# Patient Record
Sex: Female | Born: 1975
Health system: Southern US, Community
[De-identification: ages and names within clinical notes are randomized; demographics above are authoritative.]

---

## 2000-05-26 ENCOUNTER — Other Ambulatory Visit: Admission: RE | Admit: 2000-05-26 | Discharge: 2000-05-26 | Payer: Self-pay | Admitting: Neurology

## 2000-12-08 ENCOUNTER — Emergency Department (HOSPITAL_COMMUNITY): Admission: EM | Admit: 2000-12-08 | Discharge: 2000-12-08 | Payer: Self-pay | Admitting: Emergency Medicine

## 2000-12-08 ENCOUNTER — Encounter: Payer: Self-pay | Admitting: Emergency Medicine

## 2003-11-19 ENCOUNTER — Emergency Department (HOSPITAL_COMMUNITY): Admission: EM | Admit: 2003-11-19 | Discharge: 2003-11-19 | Payer: Self-pay | Admitting: Emergency Medicine

## 2005-08-30 ENCOUNTER — Ambulatory Visit (HOSPITAL_BASED_OUTPATIENT_CLINIC_OR_DEPARTMENT_OTHER): Admission: RE | Admit: 2005-08-30 | Discharge: 2005-08-30 | Payer: Self-pay | Admitting: Otolaryngology

## 2010-02-15 ENCOUNTER — Encounter: Payer: Self-pay | Admitting: Emergency Medicine

## 2010-02-16 ENCOUNTER — Encounter: Payer: Self-pay | Admitting: Emergency Medicine

## 2012-07-04 ENCOUNTER — Ambulatory Visit (HOSPITAL_COMMUNITY)
Admission: RE | Admit: 2012-07-04 | Discharge: 2012-07-04 | Disposition: A | Discharge status: Home / Self Care | Payer: Managed Care, Other (non HMO) | Source: Ambulatory Visit | Attending: *Deleted | Admitting: *Deleted

## 2012-07-04 ENCOUNTER — Ambulatory Visit (HOSPITAL_COMMUNITY): Payer: Self-pay | Admitting: Physical Therapy

## 2012-07-12 ENCOUNTER — Ambulatory Visit (HOSPITAL_COMMUNITY): Payer: Managed Care, Other (non HMO) | Admitting: Physical Therapy

## 2013-03-27 ENCOUNTER — Ambulatory Visit: Payer: Managed Care, Other (non HMO) | Admitting: Internal Medicine

## 2014-09-10 ENCOUNTER — Other Ambulatory Visit: Payer: Self-pay | Admitting: Internal Medicine

## 2014-09-10 DIAGNOSIS — N6452 Nipple discharge: Secondary | ICD-10-CM

## 2014-09-16 ENCOUNTER — Ambulatory Visit
Admission: RE | Admit: 2014-09-16 | Discharge: 2014-09-16 | Disposition: A | Discharge status: Home / Self Care | Payer: 59 | Source: Ambulatory Visit | Attending: Internal Medicine | Admitting: Internal Medicine

## 2014-09-16 DIAGNOSIS — N6452 Nipple discharge: Secondary | ICD-10-CM

## 2015-11-06 DIAGNOSIS — N6452 Nipple discharge: Secondary | ICD-10-CM | POA: Diagnosis not present

## 2015-11-06 DIAGNOSIS — F900 Attention-deficit hyperactivity disorder, predominantly inattentive type: Secondary | ICD-10-CM | POA: Diagnosis not present

## 2015-11-06 DIAGNOSIS — H7290 Unspecified perforation of tympanic membrane, unspecified ear: Secondary | ICD-10-CM | POA: Diagnosis not present

## 2015-11-06 DIAGNOSIS — Z0000 Encounter for general adult medical examination without abnormal findings: Secondary | ICD-10-CM | POA: Diagnosis not present

## 2015-11-06 DIAGNOSIS — E538 Deficiency of other specified B group vitamins: Secondary | ICD-10-CM | POA: Diagnosis not present

## 2015-11-06 DIAGNOSIS — M546 Pain in thoracic spine: Secondary | ICD-10-CM | POA: Diagnosis not present

## 2015-11-06 DIAGNOSIS — L409 Psoriasis, unspecified: Secondary | ICD-10-CM | POA: Diagnosis not present

## 2015-11-06 DIAGNOSIS — E559 Vitamin D deficiency, unspecified: Secondary | ICD-10-CM | POA: Diagnosis not present

## 2015-11-06 DIAGNOSIS — Z23 Encounter for immunization: Secondary | ICD-10-CM | POA: Diagnosis not present

## 2015-11-06 DIAGNOSIS — D538 Other specified nutritional anemias: Secondary | ICD-10-CM | POA: Diagnosis not present

## 2016-05-06 MED FILL — OMEPRAZOLE DR 20 MG CAPSULE: 20 | 30 days supply | Qty: 30 | Fill #0

## 2016-05-11 MED FILL — AMPHETAMINE SALTS 10 MG TAB: 10 | 30 days supply | Qty: 60 | Fill #0

## 2016-06-16 MED FILL — AMPHETAMINE SALTS 10 MG TAB: 10 | 30 days supply | Qty: 60 | Fill #0

## 2016-06-22 MED FILL — OMEPRAZOLE DR 20 MG CAPSULE: 20 | 90 days supply | Qty: 90 | Fill #1

## 2016-07-20 MED FILL — DEXTROAMP-AMP 10 MG TAB: 10 | 30 days supply | Qty: 60 | Fill #0

## 2016-08-18 MED FILL — ZOLPIDEM TARTRATE 10 MG TAB: 10 | 30 days supply | Qty: 30 | Fill #0

## 2016-08-18 MED FILL — CLOBETASOL 0.05% OINTMENT: 0.05 | 30 days supply | Qty: 120 | Fill #0

## 2016-08-19 MED FILL — AMPHETAMINE SALTS 10 MG TAB: 10 | 30 days supply | Qty: 60 | Fill #0

## 2016-08-23 MED FILL — ALPRAZolam 0.5 MG TABS: 0.5 | 30 days supply | Qty: 60 | Fill #0

## 2016-08-23 MED FILL — MEDROXYPROG 150 MG/ML SYR: 150 | 84 days supply | Qty: 1 | Fill #0

## 2016-09-02 MED FILL — OMEPRAZOLE DR 20 MG CAPSULE: 20 | 90 days supply | Qty: 90 | Fill #0

## 2016-09-14 MED FILL — CALCIPOTRIENE 0.005% CREAM: 0.005 | 15 days supply | Qty: 60 | Fill #0

## 2016-09-21 MED FILL — DEXTROAMP-AMP 10 MG TAB: 10 | 30 days supply | Qty: 60 | Fill #0

## 2016-10-21 MED FILL — AMPHETAMINE SALTS 10 MG TAB: 10 | 30 days supply | Qty: 60 | Fill #0

## 2016-11-18 DIAGNOSIS — F430 Acute stress reaction: Secondary | ICD-10-CM | POA: Diagnosis not present

## 2016-11-18 DIAGNOSIS — J069 Acute upper respiratory infection, unspecified: Secondary | ICD-10-CM | POA: Diagnosis not present

## 2016-11-18 DIAGNOSIS — Z1389 Encounter for screening for other disorder: Secondary | ICD-10-CM | POA: Diagnosis not present

## 2016-11-18 DIAGNOSIS — Z681 Body mass index (BMI) 19 or less, adult: Secondary | ICD-10-CM | POA: Diagnosis not present

## 2016-11-18 DIAGNOSIS — F321 Major depressive disorder, single episode, moderate: Secondary | ICD-10-CM | POA: Diagnosis not present

## 2016-12-23 MED FILL — IBUPROFEN 800 MG TABS: 800 | 30 days supply | Qty: 90 | Fill #0

## 2016-12-28 MED FILL — DEXTROAMP-AMP 10 MG TAB: 10 | 30 days supply | Qty: 60 | Fill #0

## 2017-01-07 MED FILL — medroxyPROGESTERone ACETATE: 150 | 84 days supply | Qty: 1 | Fill #1

## 2017-01-07 MED FILL — OMEPRAZOLE 20 MG CAP: 20 | 90 days supply | Qty: 90 | Fill #1

## 2017-01-07 MED FILL — DICLOFENAC SODIUM 1% GEL: 1 | 25 days supply | Qty: 300 | Fill #0

## 2017-03-15 MED FILL — FLUoxetine HCL 20 MG TABS: 20 | 30 days supply | Qty: 30 | Fill #0

## 2017-03-30 MED FILL — AMPHETAMINE-DEXTROAMPHETAMI: 10 | 30 days supply | Qty: 60 | Fill #0

## 2017-04-19 MED FILL — medroxyPROGESTERone ACETATE: 150 | 84 days supply | Qty: 1 | Fill #2

## 2017-04-19 MED FILL — FLUoxetine HCL 40 MG CAPS: 40 | 30 days supply | Qty: 30 | Fill #0

## 2017-04-19 MED FILL — OMEPRAZOLE 20 MG CAP: 20 | 90 days supply | Qty: 90 | Fill #2

## 2017-04-27 MED FILL — ADDERALL XR 20 MG CAP SA: 20 | 30 days supply | Qty: 30 | Fill #0

## 2017-05-17 MED FILL — FLUoxetine HCL 40 MG CAPS: 40 | 30 days supply | Qty: 30 | Fill #1

## 2017-06-02 MED FILL — ADDERALL XR 20 MG CAP SA: 20 | 30 days supply | Qty: 30 | Fill #0

## 2017-06-16 MED FILL — FLUoxetine HCL 40 MG CAPS: 40 | 30 days supply | Qty: 30 | Fill #2

## 2017-06-17 DIAGNOSIS — F430 Acute stress reaction: Secondary | ICD-10-CM | POA: Diagnosis not present

## 2017-07-01 MED FILL — ADDERALL XR 20 MG CAP SA: 20 | 30 days supply | Qty: 30 | Fill #0

## 2017-07-05 MED FILL — OMEPRAZOLE 20 MG CAP: 20 | 90 days supply | Qty: 90 | Fill #3

## 2017-07-07 MED FILL — valACYclovir HCL 1 GM TABS: 1 | 7 days supply | Qty: 25 | Fill #0

## 2017-07-11 DIAGNOSIS — F430 Acute stress reaction: Secondary | ICD-10-CM | POA: Diagnosis not present

## 2017-07-11 DIAGNOSIS — F321 Major depressive disorder, single episode, moderate: Secondary | ICD-10-CM | POA: Diagnosis not present

## 2017-07-13 DIAGNOSIS — Z0000 Encounter for general adult medical examination without abnormal findings: Secondary | ICD-10-CM | POA: Diagnosis not present

## 2017-07-13 DIAGNOSIS — E559 Vitamin D deficiency, unspecified: Secondary | ICD-10-CM | POA: Diagnosis not present

## 2017-07-13 DIAGNOSIS — Z1389 Encounter for screening for other disorder: Secondary | ICD-10-CM | POA: Diagnosis not present

## 2017-07-13 DIAGNOSIS — M546 Pain in thoracic spine: Secondary | ICD-10-CM | POA: Diagnosis not present

## 2017-07-13 DIAGNOSIS — F321 Major depressive disorder, single episode, moderate: Secondary | ICD-10-CM | POA: Diagnosis not present

## 2017-07-13 DIAGNOSIS — F430 Acute stress reaction: Secondary | ICD-10-CM | POA: Diagnosis not present

## 2017-07-13 DIAGNOSIS — F900 Attention-deficit hyperactivity disorder, predominantly inattentive type: Secondary | ICD-10-CM | POA: Diagnosis not present

## 2017-07-13 DIAGNOSIS — E538 Deficiency of other specified B group vitamins: Secondary | ICD-10-CM | POA: Diagnosis not present

## 2017-07-13 DIAGNOSIS — D528 Other folate deficiency anemias: Secondary | ICD-10-CM | POA: Diagnosis not present

## 2017-07-13 DIAGNOSIS — R82998 Other abnormal findings in urine: Secondary | ICD-10-CM | POA: Diagnosis not present

## 2017-07-13 DIAGNOSIS — R002 Palpitations: Secondary | ICD-10-CM | POA: Diagnosis not present

## 2017-07-19 MED FILL — IBUPROFEN 800 MG TAB: 800 | 30 days supply | Qty: 90 | Fill #1

## 2017-07-19 MED FILL — FLUoxetine HCL 40 MG CAPS: 40 | 30 days supply | Qty: 30 | Fill #3

## 2017-08-02 MED FILL — ADDERALL XR 20 MG CAP SA: 20 | 30 days supply | Qty: 30 | Fill #0

## 2017-08-05 DIAGNOSIS — F430 Acute stress reaction: Secondary | ICD-10-CM | POA: Diagnosis not present

## 2017-08-05 DIAGNOSIS — F321 Major depressive disorder, single episode, moderate: Secondary | ICD-10-CM | POA: Diagnosis not present

## 2017-08-10 MED FILL — medroxyPROGESTERone ACETATE: 150 | 84 days supply | Qty: 1 | Fill #3

## 2017-08-18 MED FILL — FLUoxetine HCL 40 MG CAPS: 40 | 30 days supply | Qty: 30 | Fill #4

## 2017-08-31 DIAGNOSIS — F321 Major depressive disorder, single episode, moderate: Secondary | ICD-10-CM | POA: Diagnosis not present

## 2017-08-31 DIAGNOSIS — F430 Acute stress reaction: Secondary | ICD-10-CM | POA: Diagnosis not present

## 2017-09-22 MED FILL — FLUoxetine HCL 40 MG CAPS: 40 | 30 days supply | Qty: 30 | Fill #5

## 2017-10-04 MED FILL — FLUTICASONE PROP 50 MCG SPR: 50 | 30 days supply | Qty: 16 | Fill #0

## 2017-10-04 MED FILL — OMEPRAZOLE 20 MG CPDR: 20 | 30 days supply | Qty: 30 | Fill #0

## 2017-10-17 DIAGNOSIS — F430 Acute stress reaction: Secondary | ICD-10-CM | POA: Diagnosis not present

## 2017-10-17 DIAGNOSIS — F1010 Alcohol abuse, uncomplicated: Secondary | ICD-10-CM | POA: Diagnosis not present

## 2017-10-17 DIAGNOSIS — F321 Major depressive disorder, single episode, moderate: Secondary | ICD-10-CM | POA: Diagnosis not present

## 2017-10-18 DIAGNOSIS — F1020 Alcohol dependence, uncomplicated: Secondary | ICD-10-CM | POA: Diagnosis not present

## 2017-10-19 DIAGNOSIS — F1020 Alcohol dependence, uncomplicated: Secondary | ICD-10-CM | POA: Diagnosis not present

## 2017-10-20 MED FILL — medroxyPROGESTERone ACETATE: 150 | 84 days supply | Qty: 1 | Fill #0

## 2017-10-20 MED FILL — FLUoxetine HCL 40 MG CAPS: 40 | 30 days supply | Qty: 30 | Fill #6

## 2017-10-21 DIAGNOSIS — F1020 Alcohol dependence, uncomplicated: Secondary | ICD-10-CM | POA: Diagnosis not present

## 2017-10-24 DIAGNOSIS — F1020 Alcohol dependence, uncomplicated: Secondary | ICD-10-CM | POA: Diagnosis not present

## 2017-10-24 DIAGNOSIS — F1110 Opioid abuse, uncomplicated: Secondary | ICD-10-CM | POA: Diagnosis not present

## 2017-10-24 DIAGNOSIS — F1220 Cannabis dependence, uncomplicated: Secondary | ICD-10-CM | POA: Diagnosis not present

## 2017-10-25 DIAGNOSIS — F1020 Alcohol dependence, uncomplicated: Secondary | ICD-10-CM | POA: Diagnosis not present

## 2017-10-26 DIAGNOSIS — F1020 Alcohol dependence, uncomplicated: Secondary | ICD-10-CM | POA: Diagnosis not present

## 2017-10-27 DIAGNOSIS — F430 Acute stress reaction: Secondary | ICD-10-CM | POA: Diagnosis not present

## 2017-10-27 DIAGNOSIS — F321 Major depressive disorder, single episode, moderate: Secondary | ICD-10-CM | POA: Diagnosis not present

## 2017-10-27 DIAGNOSIS — F1010 Alcohol abuse, uncomplicated: Secondary | ICD-10-CM | POA: Diagnosis not present

## 2017-10-28 DIAGNOSIS — F1020 Alcohol dependence, uncomplicated: Secondary | ICD-10-CM | POA: Diagnosis not present

## 2017-10-31 DIAGNOSIS — F1220 Cannabis dependence, uncomplicated: Secondary | ICD-10-CM | POA: Diagnosis not present

## 2017-10-31 DIAGNOSIS — F1020 Alcohol dependence, uncomplicated: Secondary | ICD-10-CM | POA: Diagnosis not present

## 2017-10-31 DIAGNOSIS — F1110 Opioid abuse, uncomplicated: Secondary | ICD-10-CM | POA: Diagnosis not present

## 2017-10-31 MED FILL — ADDERALL XR 20 MG CAP SA: 20 | 30 days supply | Qty: 30 | Fill #0

## 2017-10-31 MED FILL — ZOLPIDEM TARTRATE 10 MG TAB: 10 | 30 days supply | Qty: 30 | Fill #0

## 2017-11-01 DIAGNOSIS — F1020 Alcohol dependence, uncomplicated: Secondary | ICD-10-CM | POA: Diagnosis not present

## 2017-11-02 DIAGNOSIS — F1020 Alcohol dependence, uncomplicated: Secondary | ICD-10-CM | POA: Diagnosis not present

## 2017-11-02 MED FILL — OMEPRAZOLE 20 MG CPDR: 20 | 90 days supply | Qty: 90 | Fill #1

## 2017-11-03 DIAGNOSIS — F430 Acute stress reaction: Secondary | ICD-10-CM | POA: Diagnosis not present

## 2017-11-03 DIAGNOSIS — F1010 Alcohol abuse, uncomplicated: Secondary | ICD-10-CM | POA: Diagnosis not present

## 2017-11-03 DIAGNOSIS — F321 Major depressive disorder, single episode, moderate: Secondary | ICD-10-CM | POA: Diagnosis not present

## 2017-11-04 DIAGNOSIS — F1020 Alcohol dependence, uncomplicated: Secondary | ICD-10-CM | POA: Diagnosis not present

## 2017-11-07 DIAGNOSIS — F1110 Opioid abuse, uncomplicated: Secondary | ICD-10-CM | POA: Diagnosis not present

## 2017-11-07 DIAGNOSIS — F1220 Cannabis dependence, uncomplicated: Secondary | ICD-10-CM | POA: Diagnosis not present

## 2017-11-07 DIAGNOSIS — F1020 Alcohol dependence, uncomplicated: Secondary | ICD-10-CM | POA: Diagnosis not present

## 2017-11-08 DIAGNOSIS — F1020 Alcohol dependence, uncomplicated: Secondary | ICD-10-CM | POA: Diagnosis not present

## 2017-11-09 DIAGNOSIS — F1020 Alcohol dependence, uncomplicated: Secondary | ICD-10-CM | POA: Diagnosis not present

## 2017-11-10 DIAGNOSIS — F1010 Alcohol abuse, uncomplicated: Secondary | ICD-10-CM | POA: Diagnosis not present

## 2017-11-10 DIAGNOSIS — F430 Acute stress reaction: Secondary | ICD-10-CM | POA: Diagnosis not present

## 2017-11-11 DIAGNOSIS — F1020 Alcohol dependence, uncomplicated: Secondary | ICD-10-CM | POA: Diagnosis not present

## 2017-11-14 DIAGNOSIS — F1020 Alcohol dependence, uncomplicated: Secondary | ICD-10-CM | POA: Diagnosis not present

## 2017-11-14 DIAGNOSIS — F1110 Opioid abuse, uncomplicated: Secondary | ICD-10-CM | POA: Diagnosis not present

## 2017-11-14 DIAGNOSIS — F1220 Cannabis dependence, uncomplicated: Secondary | ICD-10-CM | POA: Diagnosis not present

## 2017-11-15 DIAGNOSIS — F1020 Alcohol dependence, uncomplicated: Secondary | ICD-10-CM | POA: Diagnosis not present

## 2017-11-16 DIAGNOSIS — F1020 Alcohol dependence, uncomplicated: Secondary | ICD-10-CM | POA: Diagnosis not present

## 2017-11-18 DIAGNOSIS — F1020 Alcohol dependence, uncomplicated: Secondary | ICD-10-CM | POA: Diagnosis not present

## 2017-11-21 DIAGNOSIS — F1220 Cannabis dependence, uncomplicated: Secondary | ICD-10-CM | POA: Diagnosis not present

## 2017-11-21 DIAGNOSIS — F1020 Alcohol dependence, uncomplicated: Secondary | ICD-10-CM | POA: Diagnosis not present

## 2017-11-21 DIAGNOSIS — F1110 Opioid abuse, uncomplicated: Secondary | ICD-10-CM | POA: Diagnosis not present

## 2017-11-22 DIAGNOSIS — F1020 Alcohol dependence, uncomplicated: Secondary | ICD-10-CM | POA: Diagnosis not present

## 2017-11-22 MED FILL — FLUoxetine HCL 40 MG CAPS: 40 | 90 days supply | Qty: 90 | Fill #0

## 2017-11-23 DIAGNOSIS — F1020 Alcohol dependence, uncomplicated: Secondary | ICD-10-CM | POA: Diagnosis not present

## 2017-11-24 DIAGNOSIS — F430 Acute stress reaction: Secondary | ICD-10-CM | POA: Diagnosis not present

## 2017-11-24 DIAGNOSIS — F900 Attention-deficit hyperactivity disorder, predominantly inattentive type: Secondary | ICD-10-CM | POA: Diagnosis not present

## 2017-11-24 DIAGNOSIS — F1010 Alcohol abuse, uncomplicated: Secondary | ICD-10-CM | POA: Diagnosis not present

## 2017-11-24 DIAGNOSIS — F321 Major depressive disorder, single episode, moderate: Secondary | ICD-10-CM | POA: Diagnosis not present

## 2017-11-25 DIAGNOSIS — F1020 Alcohol dependence, uncomplicated: Secondary | ICD-10-CM | POA: Diagnosis not present

## 2017-11-28 DIAGNOSIS — F1220 Cannabis dependence, uncomplicated: Secondary | ICD-10-CM | POA: Diagnosis not present

## 2017-11-28 DIAGNOSIS — F1110 Opioid abuse, uncomplicated: Secondary | ICD-10-CM | POA: Diagnosis not present

## 2017-11-28 DIAGNOSIS — F1020 Alcohol dependence, uncomplicated: Secondary | ICD-10-CM | POA: Diagnosis not present

## 2017-11-29 DIAGNOSIS — F1020 Alcohol dependence, uncomplicated: Secondary | ICD-10-CM | POA: Diagnosis not present

## 2017-11-30 DIAGNOSIS — F1020 Alcohol dependence, uncomplicated: Secondary | ICD-10-CM | POA: Diagnosis not present

## 2017-11-30 MED FILL — ADDERALL XR 20 MG CAP SA: 20 | 30 days supply | Qty: 30 | Fill #0

## 2017-12-02 DIAGNOSIS — F1020 Alcohol dependence, uncomplicated: Secondary | ICD-10-CM | POA: Diagnosis not present

## 2017-12-05 DIAGNOSIS — F1020 Alcohol dependence, uncomplicated: Secondary | ICD-10-CM | POA: Diagnosis not present

## 2017-12-05 DIAGNOSIS — F1110 Opioid abuse, uncomplicated: Secondary | ICD-10-CM | POA: Diagnosis not present

## 2017-12-05 DIAGNOSIS — F1220 Cannabis dependence, uncomplicated: Secondary | ICD-10-CM | POA: Diagnosis not present

## 2017-12-06 DIAGNOSIS — F1020 Alcohol dependence, uncomplicated: Secondary | ICD-10-CM | POA: Diagnosis not present

## 2017-12-07 DIAGNOSIS — F1020 Alcohol dependence, uncomplicated: Secondary | ICD-10-CM | POA: Diagnosis not present

## 2017-12-09 DIAGNOSIS — F1020 Alcohol dependence, uncomplicated: Secondary | ICD-10-CM | POA: Diagnosis not present

## 2017-12-09 DIAGNOSIS — F430 Acute stress reaction: Secondary | ICD-10-CM | POA: Diagnosis not present

## 2017-12-09 DIAGNOSIS — F1010 Alcohol abuse, uncomplicated: Secondary | ICD-10-CM | POA: Diagnosis not present

## 2017-12-12 DIAGNOSIS — F1110 Opioid abuse, uncomplicated: Secondary | ICD-10-CM | POA: Diagnosis not present

## 2017-12-12 DIAGNOSIS — F1220 Cannabis dependence, uncomplicated: Secondary | ICD-10-CM | POA: Diagnosis not present

## 2017-12-12 DIAGNOSIS — F1020 Alcohol dependence, uncomplicated: Secondary | ICD-10-CM | POA: Diagnosis not present

## 2017-12-13 DIAGNOSIS — F1020 Alcohol dependence, uncomplicated: Secondary | ICD-10-CM | POA: Diagnosis not present

## 2017-12-13 MED FILL — ZOLPIDEM TARTRATE 10 MG TAB: 10 | 30 days supply | Qty: 30 | Fill #1

## 2017-12-14 DIAGNOSIS — F1020 Alcohol dependence, uncomplicated: Secondary | ICD-10-CM | POA: Diagnosis not present

## 2017-12-16 DIAGNOSIS — F1020 Alcohol dependence, uncomplicated: Secondary | ICD-10-CM | POA: Diagnosis not present

## 2017-12-19 DIAGNOSIS — F1110 Opioid abuse, uncomplicated: Secondary | ICD-10-CM | POA: Diagnosis not present

## 2017-12-19 DIAGNOSIS — F1020 Alcohol dependence, uncomplicated: Secondary | ICD-10-CM | POA: Diagnosis not present

## 2017-12-19 DIAGNOSIS — F1220 Cannabis dependence, uncomplicated: Secondary | ICD-10-CM | POA: Diagnosis not present

## 2017-12-20 DIAGNOSIS — F1020 Alcohol dependence, uncomplicated: Secondary | ICD-10-CM | POA: Diagnosis not present

## 2017-12-21 DIAGNOSIS — F1020 Alcohol dependence, uncomplicated: Secondary | ICD-10-CM | POA: Diagnosis not present

## 2017-12-23 DIAGNOSIS — F1020 Alcohol dependence, uncomplicated: Secondary | ICD-10-CM | POA: Diagnosis not present

## 2017-12-26 DIAGNOSIS — F1020 Alcohol dependence, uncomplicated: Secondary | ICD-10-CM | POA: Diagnosis not present

## 2017-12-27 DIAGNOSIS — F1020 Alcohol dependence, uncomplicated: Secondary | ICD-10-CM | POA: Diagnosis not present

## 2017-12-28 DIAGNOSIS — F1020 Alcohol dependence, uncomplicated: Secondary | ICD-10-CM | POA: Diagnosis not present

## 2017-12-28 DIAGNOSIS — F430 Acute stress reaction: Secondary | ICD-10-CM | POA: Diagnosis not present

## 2017-12-28 DIAGNOSIS — F1010 Alcohol abuse, uncomplicated: Secondary | ICD-10-CM | POA: Diagnosis not present

## 2017-12-28 MED FILL — ADDERALL XR 20 MG CAP SA: 20 | 30 days supply | Qty: 30 | Fill #0

## 2018-01-30 MED FILL — ADDERALL XR 20 MG CAP SA: 20 | 30 days supply | Qty: 30 | Fill #0

## 2018-01-31 MED FILL — ZOLPIDEM TARTRATE 10 MG TAB: 10 | 30 days supply | Qty: 30 | Fill #2

## 2018-01-31 MED FILL — OMEPRAZOLE 20 MG CPDR: 20 | 90 days supply | Qty: 90 | Fill #2

## 2018-02-17 MED FILL — FLUoxetine HCL 40 MG CAPS: 40 | 90 days supply | Qty: 90 | Fill #0

## 2018-02-27 MED FILL — ADDERALL XR 20 MG CAP SA: 20 | 30 days supply | Qty: 30 | Fill #0

## 2018-03-31 MED FILL — IBUPROFEN 800 MG TABS: 800 | 30 days supply | Qty: 90 | Fill #0

## 2018-03-31 MED FILL — METHOCARBAMOL 500 MG TABS: 500 | 30 days supply | Qty: 90 | Fill #0

## 2018-03-31 MED FILL — ADDERALL XR 20 MG CAP SA: 20 | 30 days supply | Qty: 30 | Fill #0

## 2018-04-20 MED FILL — ZOLPIDEM TARTRATE 10 MG TAB: 10 | 30 days supply | Qty: 30 | Fill #0

## 2018-04-26 MED FILL — valACYclovir HCL 1 GM TABS: 1 | 7 days supply | Qty: 25 | Fill #0

## 2018-04-26 MED FILL — OMEPRAZOLE 20 MG CPDR: 20 | 90 days supply | Qty: 90 | Fill #0

## 2018-05-01 MED FILL — ADDERALL XR 20 MG CAP SA: 20 | 30 days supply | Qty: 30 | Fill #0

## 2018-05-23 MED FILL — FLUoxetine HCL 40 MG CAPS: 40 | 90 days supply | Qty: 90 | Fill #0

## 2018-05-23 MED FILL — FLUTICASONE PROP 50 MCG SPR: 50 | 30 days supply | Qty: 16 | Fill #0

## 2018-05-30 MED FILL — ADDERALL XR 20 MG CAP SA: 20 | 30 days supply | Qty: 30 | Fill #0

## 2018-06-23 MED FILL — ZOLPIDEM TARTRATE 10 MG TAB: 10 | 30 days supply | Qty: 30 | Fill #0

## 2018-06-29 MED FILL — ADDERALL XR 20 MG CAP SA: 20 | 30 days supply | Qty: 30 | Fill #0

## 2018-07-31 MED FILL — ZOLPIDEM TARTRATE 10 MG TAB: 10 | 30 days supply | Qty: 30 | Fill #1

## 2018-08-01 MED FILL — ADDERALL XR 20 MG CAP SA: 20 | 30 days supply | Qty: 30 | Fill #0

## 2018-08-21 MED FILL — FLUoxetine HCL 40 MG CAPS: 40 | 90 days supply | Qty: 90 | Fill #1

## 2018-08-21 MED FILL — OMEPRAZOLE 20 MG CAP: 20 | 90 days supply | Qty: 90 | Fill #0

## 2018-08-31 MED FILL — ADDERALL XR 20 MG CAP SA: 20 | 30 days supply | Qty: 30 | Fill #0

## 2018-10-04 MED FILL — ADDERALL XR 20 MG CAP SA: 20 | 30 days supply | Qty: 30 | Fill #0

## 2018-11-04 MED FILL — ADDERALL XR 20 MG CAP SA: 20 | 30 days supply | Qty: 30 | Fill #0

## 2018-11-15 MED FILL — FLUoxetine HCL 40 MG CAPS: 40 | 90 days supply | Qty: 90 | Fill #2

## 2018-11-15 MED FILL — OMEPRAZOLE 20 MG CAP: 20 | 90 days supply | Qty: 90 | Fill #0

## 2018-11-28 MED FILL — ZOLPIDEM TARTRATE 10 MG TAB: 10 | 30 days supply | Qty: 30 | Fill #2

## 2018-12-05 MED FILL — ADDERALL XR 20 MG CAP SA: 20 | 30 days supply | Qty: 30 | Fill #0

## 2018-12-12 DIAGNOSIS — R82998 Other abnormal findings in urine: Secondary | ICD-10-CM | POA: Diagnosis not present

## 2018-12-12 DIAGNOSIS — Z0000 Encounter for general adult medical examination without abnormal findings: Secondary | ICD-10-CM | POA: Diagnosis not present

## 2018-12-12 DIAGNOSIS — F1010 Alcohol abuse, uncomplicated: Secondary | ICD-10-CM | POA: Diagnosis not present

## 2018-12-12 DIAGNOSIS — D539 Nutritional anemia, unspecified: Secondary | ICD-10-CM | POA: Diagnosis not present

## 2018-12-12 DIAGNOSIS — M546 Pain in thoracic spine: Secondary | ICD-10-CM | POA: Diagnosis not present

## 2018-12-12 DIAGNOSIS — F321 Major depressive disorder, single episode, moderate: Secondary | ICD-10-CM | POA: Diagnosis not present

## 2018-12-12 DIAGNOSIS — E559 Vitamin D deficiency, unspecified: Secondary | ICD-10-CM | POA: Diagnosis not present

## 2018-12-12 DIAGNOSIS — E538 Deficiency of other specified B group vitamins: Secondary | ICD-10-CM | POA: Diagnosis not present

## 2018-12-12 DIAGNOSIS — J069 Acute upper respiratory infection, unspecified: Secondary | ICD-10-CM | POA: Diagnosis not present

## 2018-12-12 MED FILL — valACYclovir HCL 1 GM TABS: 1 | 7 days supply | Qty: 25 | Fill #0

## 2018-12-22 DIAGNOSIS — Z20828 Contact with and (suspected) exposure to other viral communicable diseases: Secondary | ICD-10-CM | POA: Diagnosis not present

## 2019-01-05 MED FILL — ADDERALL XR 20 MG CAP SA: 20 | 30 days supply | Qty: 30 | Fill #0

## 2019-02-08 MED FILL — ADDERALL XR 20 MG CAP SA: 20 | 30 days supply | Qty: 30 | Fill #0

## 2019-02-12 MED FILL — OMEPRAZOLE 20 MG CAP: 20 | 90 days supply | Qty: 90 | Fill #0

## 2019-02-12 MED FILL — FLUoxetine HCL 40 MG CAPS: 40 | 90 days supply | Qty: 90 | Fill #0

## 2019-02-20 MED FILL — METHOCARBAMOL 500 MG TABS: 500 | 30 days supply | Qty: 90 | Fill #0

## 2019-02-20 MED FILL — IBUPROFEN 800 MG TAB: 800 | 30 days supply | Qty: 90 | Fill #0

## 2019-03-08 MED FILL — ADDERALL XR 20 MG CAP SA: 20 | 30 days supply | Qty: 30 | Fill #0

## 2019-04-11 MED FILL — ADDERALL XR 20 MG CAP SA: 20 | 30 days supply | Qty: 30 | Fill #0

## 2019-05-10 MED FILL — ADDERALL XR 20 MG CAP SA: 20 | 60 days supply | Qty: 60 | Fill #0

## 2019-05-10 MED FILL — valACYclovir HCL 1 GM TABS: 1 | 7 days supply | Qty: 25 | Fill #1

## 2019-05-10 MED FILL — OMEPRAZOLE 20 MG CAP: 20 | 90 days supply | Qty: 90 | Fill #1

## 2019-05-10 MED FILL — FLUoxetine HCL 40 MG CAPS: 40 | 90 days supply | Qty: 90 | Fill #1

## 2019-05-18 DIAGNOSIS — M546 Pain in thoracic spine: Secondary | ICD-10-CM | POA: Diagnosis not present

## 2019-05-18 DIAGNOSIS — M545 Low back pain: Secondary | ICD-10-CM | POA: Diagnosis not present

## 2019-05-18 DIAGNOSIS — Z780 Asymptomatic menopausal state: Secondary | ICD-10-CM | POA: Diagnosis not present

## 2019-05-18 DIAGNOSIS — N912 Amenorrhea, unspecified: Secondary | ICD-10-CM | POA: Diagnosis not present

## 2019-05-18 DIAGNOSIS — M5134 Other intervertebral disc degeneration, thoracic region: Secondary | ICD-10-CM | POA: Diagnosis not present

## 2019-05-22 DIAGNOSIS — F430 Acute stress reaction: Secondary | ICD-10-CM | POA: Diagnosis not present

## 2019-06-01 DIAGNOSIS — M455 Ankylosing spondylitis of thoracolumbar region: Secondary | ICD-10-CM | POA: Diagnosis not present

## 2019-06-01 DIAGNOSIS — F430 Acute stress reaction: Secondary | ICD-10-CM | POA: Diagnosis not present

## 2019-06-04 DIAGNOSIS — N912 Amenorrhea, unspecified: Secondary | ICD-10-CM | POA: Diagnosis not present

## 2019-06-04 DIAGNOSIS — M5134 Other intervertebral disc degeneration, thoracic region: Secondary | ICD-10-CM | POA: Diagnosis not present

## 2019-06-04 DIAGNOSIS — F1010 Alcohol abuse, uncomplicated: Secondary | ICD-10-CM | POA: Diagnosis not present

## 2019-06-04 DIAGNOSIS — F430 Acute stress reaction: Secondary | ICD-10-CM | POA: Diagnosis not present

## 2019-06-04 DIAGNOSIS — Z7689 Persons encountering health services in other specified circumstances: Secondary | ICD-10-CM | POA: Diagnosis not present

## 2019-06-04 DIAGNOSIS — M545 Low back pain: Secondary | ICD-10-CM | POA: Diagnosis not present

## 2019-06-04 DIAGNOSIS — Z780 Asymptomatic menopausal state: Secondary | ICD-10-CM | POA: Diagnosis not present

## 2019-06-04 DIAGNOSIS — M542 Cervicalgia: Secondary | ICD-10-CM | POA: Diagnosis not present

## 2019-06-04 DIAGNOSIS — F308 Other manic episodes: Secondary | ICD-10-CM | POA: Diagnosis not present

## 2019-06-04 DIAGNOSIS — F900 Attention-deficit hyperactivity disorder, predominantly inattentive type: Secondary | ICD-10-CM | POA: Diagnosis not present

## 2019-06-12 DIAGNOSIS — F430 Acute stress reaction: Secondary | ICD-10-CM | POA: Diagnosis not present

## 2019-06-21 DIAGNOSIS — Z716 Tobacco abuse counseling: Secondary | ICD-10-CM | POA: Diagnosis not present

## 2019-06-21 DIAGNOSIS — Z124 Encounter for screening for malignant neoplasm of cervix: Secondary | ICD-10-CM | POA: Diagnosis not present

## 2019-06-21 DIAGNOSIS — Z1151 Encounter for screening for human papillomavirus (HPV): Secondary | ICD-10-CM | POA: Diagnosis not present

## 2019-06-21 DIAGNOSIS — R232 Flushing: Secondary | ICD-10-CM | POA: Diagnosis not present

## 2019-06-21 DIAGNOSIS — Z780 Asymptomatic menopausal state: Secondary | ICD-10-CM | POA: Diagnosis not present

## 2019-06-21 DIAGNOSIS — Z1231 Encounter for screening mammogram for malignant neoplasm of breast: Secondary | ICD-10-CM | POA: Diagnosis not present

## 2019-06-21 DIAGNOSIS — Z6824 Body mass index (BMI) 24.0-24.9, adult: Secondary | ICD-10-CM | POA: Diagnosis not present

## 2019-06-21 DIAGNOSIS — Z01419 Encounter for gynecological examination (general) (routine) without abnormal findings: Secondary | ICD-10-CM | POA: Diagnosis not present

## 2019-06-21 MED FILL — ESTRADIOL-NORETHINDRONE ACE: 1-0.5 | 84 days supply | Qty: 84 | Fill #0

## 2019-06-22 DIAGNOSIS — Z79891 Long term (current) use of opiate analgesic: Secondary | ICD-10-CM | POA: Diagnosis not present

## 2019-06-22 DIAGNOSIS — F1021 Alcohol dependence, in remission: Secondary | ICD-10-CM | POA: Diagnosis not present

## 2019-06-22 DIAGNOSIS — F3113 Bipolar disorder, current episode manic without psychotic features, severe: Secondary | ICD-10-CM | POA: Diagnosis not present

## 2019-06-22 MED FILL — FLUoxetine HCL 10 MG CAPS: 10 | 30 days supply | Qty: 30 | Fill #0

## 2019-06-22 MED FILL — HYDROXYZINE PAM 50 MG CAP: 50 | 30 days supply | Qty: 90 | Fill #0

## 2019-06-22 MED FILL — VRAYLAR 4.5 MG CAPSULE: 4.5 | 30 days supply | Qty: 30 | Fill #0

## 2019-06-22 MED FILL — CYCLOBENZAPRINE HCL 5 MG TA: 5 | 30 days supply | Qty: 60 | Fill #0

## 2019-07-06 DIAGNOSIS — F3111 Bipolar disorder, current episode manic without psychotic features, mild: Secondary | ICD-10-CM | POA: Diagnosis not present

## 2019-07-06 DIAGNOSIS — F1021 Alcohol dependence, in remission: Secondary | ICD-10-CM | POA: Diagnosis not present

## 2019-07-09 ENCOUNTER — Other Ambulatory Visit: Payer: Self-pay

## 2019-07-09 ENCOUNTER — Ambulatory Visit (INDEPENDENT_AMBULATORY_CARE_PROVIDER_SITE_OTHER): Payer: 59 | Admitting: Licensed Clinical Social Worker

## 2019-07-09 DIAGNOSIS — F3110 Bipolar disorder, current episode manic without psychotic features, unspecified: Secondary | ICD-10-CM

## 2019-07-09 MED FILL — CYCLOBENZAPRINE HCL 5 MG TA: 5 | 30 days supply | Qty: 90 | Fill #0

## 2019-07-09 NOTE — Patient Instructions (Signed)
Caring for Your Mental Health Mental health is emotional, psychological, and social well-being. Mental health is just as important as physical health. In fact, mental and physical health are connected, and you need both to be healthy. Some signs of good mental health (well-being) include:  Being able to attend to tasks at home, school, or work.  Being able to manage stress and emotions.  Practicing self-care, which may include: ? A regular exercise pattern. ? A reasonably healthy diet. ? Supportive and trusting relationships. ? The ability to relax and calm yourself (self-calm).  Having pleasurable hobbies and activities to do.  Believing that you have meaning and purpose in your life.  Recovering and adjusting after facing challenges (resilience). You can take steps to build or strengthen these mentally healthy behaviors. There are resources and support to help you with this. Why is caring for mental health important? Caring for your mental health is a big part of staying healthy. Everyone has times when feelings, thoughts, or situations feel overwhelming. Mental health means having the skills to manage what feels overwhelming. If this sense of being overwhelmed persists, however, you might need some help. If you have some of the following signs, you may need to take better care of your mental health or seek help from a health care provider or mental health professional:  Problems with energy or focus.  Changes in eating habits.  Problems sleeping, such as sleeping too much or not enough.  Emotional distress, such as anger, sadness, depression, or anxiety.  Major changes in your relationships.  Losing interest in life or activities that you used to enjoy. If you have any of these symptoms on most days for 2 weeks or longer:  Talk with a close friend or family member about how you are feeling.  Contact your health care provider to discuss your symptoms.  Consider working with a  mental health professional. Your health care provider, family, or friends may be able to recommend a therapist. What can I do to promote emotional and mental health? Managing emotions  Learn to identify emotions and deal with them. Recognizing your emotions is the first step in learning to deal with them.  Practice ways to appropriately express feelings. Remember that you can control your feelings. They do not control you.  Practice stress management techniques, such as: ? Relaxation techniques, like breathing or muscle relaxation exercises. ? Exercise. Regular activity can lower your stress level. ? Changing what you can change and accepting what you cannot change.  Build up your resilience so that you can recover and adjust after big problems or challenges. Practice resilient behaviors and attitudes: ? Set and focus on long-term goals. ? Develop and maintain healthy, supportive relationships. ? Learn to accept change and make the best of the situation. ? Take care of yourself physically by eating a healthy diet, getting plenty of sleep, and exercising regularly. ? Develop self-awareness. Ask others to give feedback about how they see you. ? Practice mindfulness meditation to help you stay calm when dealing with daily challenges. ? Learn to respond to situations in healthy ways, rather than reacting with your emotions. ? Keep a positive attitude, and believe in yourself. Your view of yourself affects your mental health. ? Develop your listening and empathy skills. These will help you deal with difficult situations and communications.  Remember that emotions can be used as a good source of communication and are a great source of energy. Try to laugh and find humor in life.   Sleeping  Get the right amount and quality of sleep. Sleep has a big impact on physical and mental health. To improve your sleep: ? Go to bed and wake up around the same time every day. ? Limit screen time before  bedtime. This includes the use of your cell phone, TV, computer, and tablet. ? Keep your bedroom dark and cool. Activity   Exercise or do some physical activity regularly. This helps: ? Keep your body strong, especially during times of stress. ? Get rid of chemicals in your body (hormones) that build up when you are stressed. ? Build up your resilience. Eating and drinking   Eat a healthy diet that includes whole grains, vegetables, fresh fruits, and lean proteins. If you have questions about what foods are best for you, ask your health care provider.  Try not to turn to sweet, salty, or otherwise unhealthy foods when you are tired or unhappy. This can lead to unwanted weight gain and is not a healthy way to cope with emotions. Where to find more information You can find more information about how to care for your mental health from:  National Alliance on Mental Illness (NAMI): www.nami.org  National Institute of Mental Health: www.nimh.nih.gov  Centers for Disease Control and Prevention: www.cdc.gov/hrqol/wellbeing.htm Contact a health care provider if:  You lose interest in being with others or you do not want to leave the house.  You have a hard time completing your normal activities or you have less energy than normal.  You cannot stay focused or you have problems with memory.  You feel that your senses are heightened, and this makes you upset or concerned.  You feel nervous or have rapid mood changes.  You are sleeping or eating more or less than normal.  You question reality or you show odd behavior that disturbs you or others. Get help right away if:  You have thoughts about hurting yourself or others. If you ever feel like you may hurt yourself or others, or have thoughts about taking your own life, get help right away. You can go to your nearest emergency department or call:  Your local emergency services (911 in the [place]  A suicide crisis helpline, such as the  National Suicide Prevention Lifeline at 1-800-273-8255. This is open 24 hours a day. Summary  Mental health is not just the absence of mental illness. It involves understanding your emotions and behaviors, and taking steps to cope with them in a healthy way.  If you have symptoms of mental or emotional distress, get help from family, friends, a health care provider, or a mental health professional.  Practice good mental health behaviors such as stress management skills, self-calming skills, exercise, and healthy sleeping and eating. This information is not intended to replace advice given to you by your health care provider. Make sure you discuss any questions you have with your health care provider. Document Revised: 12/24/2016 Document Reviewed: 05/25/2016 Elsevier Patient Education  2020 [employer]  Mindfulness-Based Stress Reduction Mindfulness-based stress reduction (MBSR) is a program that helps people learn to practice mindfulness. Mindfulness is the practice of intentionally paying attention to the present moment. It can be learned and practiced through techniques such as education, breathing exercises, meditation, and yoga. MBSR includes several mindfulness techniques in one program. MBSR works best when you understand the treatment, are willing to try new things, and can commit to spending time practicing what you learn. MBSR training may include learning about:  How your emotions, thoughts,   and reactions affect your body.  New ways to respond to things that cause negative thoughts to start (triggers).  How to notice your thoughts and let go of them.  Practicing awareness of everyday things that you normally do without thinking.  The techniques and goals of different types of meditation. What are the benefits of MBSR? MBSR can have many benefits, which include helping you to:  Develop self-awareness. This refers to knowing and understanding yourself.  Learn skills and  attitudes that help you to participate in your own health care.  Learn new ways to care for yourself.  Be more accepting about how things are, and let things go.  Be less judgmental and approach things with an open mind.  Be patient with yourself and trust yourself more. MBSR has also been shown to:  Reduce negative emotions, such as depression and anxiety.  Improve memory and focus.  Change how you sense and approach pain.  Boost your body's ability to fight infections.  Help you connect better with other people.  Improve your sense of well-being. Follow these instructions at home:   Find a local in-person or online MBSR program.  Set aside some time regularly for mindfulness practice.  Find a mindfulness practice that works best for you. This may include one or more of the following: ? Meditation. Meditation involves focusing your mind on a certain thought or activity. ? Breathing awareness exercises. These help you to stay present by focusing on your breath. ? Body scan. For this practice, you lie down and pay attention to each part of your body from head to toe. You can identify tension and soreness and intentionally relax parts of your body. ? Yoga. Yoga involves stretching and breathing, and it can improve your ability to move and be flexible. It can also provide an experience of testing your body's limits, which can help you release stress. ? Mindful eating. This way of eating involves focusing on the taste, texture, color, and smell of each bite of food. Because this slows down eating and helps you feel full sooner, it can be an important part of a weight-loss plan.  Find a podcast or recording that provides guidance for breathing awareness, body scan, or meditation exercises. You can listen to these any time when you have a free moment to rest without distractions.  Follow your treatment plan as told by your health care provider. This may include taking regular  medicines and making changes to your diet or lifestyle as recommended. How to practice mindfulness To do a basic awareness exercise:  Find a comfortable place to sit.  Pay attention to the present moment. Observe your thoughts, feelings, and surroundings just as they are.  Avoid placing judgment on yourself, your feelings, or your surroundings. Make note of any judgment that comes up, and let it go.  Your mind may wander, and that is okay. Make note of when your thoughts drift, and return your attention to the present moment. To do basic mindfulness meditation:  Find a comfortable place to sit. This may include a stable chair or a firm floor cushion. ? Sit upright with your back straight. Let your arms fall next to your side with your hands resting on your legs. ? If sitting in a chair, rest your feet flat on the floor. ? If sitting on a cushion, cross your legs in front of you.  Keep your head in a neutral position with your chin dropped slightly. Relax your jaw and rest   the tip of your tongue on the roof of your mouth. Drop your gaze to the floor. You can close your eyes if you like.  Breathe normally and pay attention to your breath. Feel the air moving in and out of your nose. Feel your belly expanding and relaxing with each breath.  Your mind may wander, and that is okay. Make note of when your thoughts drift, and return your attention to your breath.  Avoid placing judgment on yourself, your feelings, or your surroundings. Make note of any judgment or feelings that come up, let them go, and bring your attention back to your breath.  When you are ready, lift your gaze or open your eyes. Pay attention to how your body feels after the meditation. Where to find more information You can find more information about MBSR from:  Your health care provider.  Community-based meditation centers or programs.  Programs offered near you. Summary  Mindfulness-based stress reduction (MBSR)  is a program that teaches you how to intentionally pay attention to the present moment. It is used with other treatments to help you cope better with daily stress, emotions, and pain.  MBSR focuses on developing self-awareness, which allows you to respond to life stress without judgment or negative emotions.  MBSR programs may involve learning different mindfulness practices, such as breathing exercises, meditation, yoga, body scan, or mindful eating. Find a mindfulness practice that works best for you, and set aside time for it on a regular basis. This information is not intended to replace advice given to you by your health care provider. Make sure you discuss any questions you have with your health care provider. Document Revised: 12/24/2016 Document Reviewed: 05/20/2016 Elsevier Patient Education  2020 [employer] Inc.  

## 2019-07-09 NOTE — Progress Notes (Signed)
Virtual Visit via Video Note  I connected with Marie Montgomery on 07/09/19 at 10:00 AM EDT by a video enabled telemedicine application and verified that I am speaking with the correct person using two identifiers.  Location: Patient: home  Provider: [facility]   I discussed the limitations of evaluation and management by telemedicine and the availability of in person appointments. The patient expressed understanding and agreed to proceed.   I discussed the assessment and treatment plan with the patient. The patient was provided an opportunity to ask questions and all were answered. The patient agreed with the plan and demonstrated an understanding of the instructions.   The patient was advised to call back or seek an in-person evaluation if the symptoms worsen or if the condition fails to improve as anticipated.  I provided 45 minutes of non-face-to-face time during this encounter.   Shron Ozer R Ladell Bey, LCSW    THERAPIST PROGRESS NOTE  Session Time: 45 min  Participation Level: Active  Behavioral Response: NeatAlertpleasant  Type of Therapy: Individual Therapy  Treatment Goals addressed: Anxiety and Communication: communication skills with partner  Interventions: Supportive and Other: communication skills  Summary: Marie Montgomery is a 44 y.o. female who presents with improving mood stabilization following recent visit with psychiatric NP, Liam Rogers at [facility]. Marie Montgomery dx pt with bipolar I disorder and pt is currently taking Vraylar--which is currently managing symptoms well.  Allowed pt to explore and express thoughts and feelings surrounding marriage concerns. Discussed importance of communication skills--reviewed communication skills. Pt reports improvements in coping with routine external stressors. Encouraged pt to continue physical activity, seeking spiritual guidance, and making overall wellness a priority.    Suicidal/Homicidal: No  Therapist  Response: Marie Montgomery is progressing towards goals of improving overall mood and anxiety. Pt has developed good relationship with NP at [facility], and feels that new medication (Vraylar) has stabilized mood well.   Plan: Return again in 3 weeks.  Diagnosis: Axis I: Bipolar, Manic    Axis II: No diagnosis    Marie Haber Jyquan Kenley, LCSW 07/09/2019

## 2019-07-20 ENCOUNTER — Other Ambulatory Visit (HOSPITAL_COMMUNITY): Payer: Self-pay | Admitting: *Deleted

## 2019-07-20 DIAGNOSIS — F3111 Bipolar disorder, current episode manic without psychotic features, mild: Secondary | ICD-10-CM | POA: Diagnosis not present

## 2019-07-20 DIAGNOSIS — F1021 Alcohol dependence, in remission: Secondary | ICD-10-CM | POA: Diagnosis not present

## 2019-07-20 MED FILL — ADDERALL XR 20 MG CAP SA: 20 | 30 days supply | Qty: 30 | Fill #0

## 2019-07-23 ENCOUNTER — Ambulatory Visit: Payer: 59 | Admitting: Licensed Clinical Social Worker

## 2019-07-23 ENCOUNTER — Other Ambulatory Visit: Payer: Self-pay

## 2019-07-31 ENCOUNTER — Other Ambulatory Visit: Payer: Self-pay | Admitting: Internal Medicine

## 2019-07-31 DIAGNOSIS — E28319 Asymptomatic premature menopause: Secondary | ICD-10-CM | POA: Diagnosis not present

## 2019-07-31 DIAGNOSIS — Z803 Family history of malignant neoplasm of breast: Secondary | ICD-10-CM | POA: Diagnosis not present

## 2019-07-31 DIAGNOSIS — M545 Low back pain, unspecified: Secondary | ICD-10-CM

## 2019-08-02 MED FILL — OMEPRAZOLE 20 MG CAP: 20 | 90 days supply | Qty: 90 | Fill #2

## 2019-08-04 ENCOUNTER — Other Ambulatory Visit: Payer: Self-pay

## 2019-08-04 ENCOUNTER — Ambulatory Visit
Admission: RE | Admit: 2019-08-04 | Discharge: 2019-08-04 | Disposition: A | Discharge status: Home / Self Care | Payer: 59 | Source: Ambulatory Visit | Attending: Internal Medicine | Admitting: Internal Medicine

## 2019-08-04 DIAGNOSIS — M545 Low back pain, unspecified: Secondary | ICD-10-CM

## 2019-08-04 DIAGNOSIS — M5126 Other intervertebral disc displacement, lumbar region: Secondary | ICD-10-CM | POA: Diagnosis not present

## 2019-08-09 DIAGNOSIS — F1021 Alcohol dependence, in remission: Secondary | ICD-10-CM | POA: Diagnosis not present

## 2019-08-09 DIAGNOSIS — F3113 Bipolar disorder, current episode manic without psychotic features, severe: Secondary | ICD-10-CM | POA: Diagnosis not present

## 2019-08-09 MED FILL — FLUoxetine HCL 20 MG CAPS: 20 | 30 days supply | Qty: 30 | Fill #1

## 2019-08-09 MED FILL — CYCLOBENZAPRINE HCL 5 MG TA: 5 | 30 days supply | Qty: 90 | Fill #1

## 2019-08-13 MED FILL — valACYclovir HCL 1 GM TABS: 1 | 7 days supply | Qty: 25 | Fill #2

## 2019-08-14 MED FILL — IBUPROFEN 800 MG TAB: 800 | 30 days supply | Qty: 90 | Fill #1

## 2019-08-21 DIAGNOSIS — F3131 Bipolar disorder, current episode depressed, mild: Secondary | ICD-10-CM | POA: Diagnosis not present

## 2019-08-21 DIAGNOSIS — F1021 Alcohol dependence, in remission: Secondary | ICD-10-CM | POA: Diagnosis not present

## 2019-08-21 MED FILL — VRAYLAR 3 MG CAPSULE: 3 | 30 days supply | Qty: 30 | Fill #0

## 2019-08-21 MED FILL — ZOLPIDEM TARTRATE 10 MG TAB: 10 | 30 days supply | Qty: 30 | Fill #0

## 2019-08-21 MED FILL — FLUoxetine HCL 20 MG CAPS: 20 | 30 days supply | Qty: 60 | Fill #0

## 2019-08-21 MED FILL — ADDERALL XR 20 MG CAP SA: 20 | 30 days supply | Qty: 30 | Fill #0

## 2019-08-27 DIAGNOSIS — F1021 Alcohol dependence, in remission: Secondary | ICD-10-CM | POA: Diagnosis not present

## 2019-08-27 DIAGNOSIS — F3131 Bipolar disorder, current episode depressed, mild: Secondary | ICD-10-CM | POA: Diagnosis not present

## 2019-09-03 MED FILL — VRAYLAR 1.5 MG CAPSULE: 1.5 | 30 days supply | Qty: 30 | Fill #0

## 2019-09-17 MED FILL — ESTRADIOL-NORETHINDRONE ACE: 1-0.5 | 84 days supply | Qty: 84 | Fill #1

## 2019-09-24 DIAGNOSIS — F3131 Bipolar disorder, current episode depressed, mild: Secondary | ICD-10-CM | POA: Diagnosis not present

## 2019-09-24 DIAGNOSIS — F1021 Alcohol dependence, in remission: Secondary | ICD-10-CM | POA: Diagnosis not present

## 2019-09-26 MED FILL — ADDERALL XR 20 MG CAP SA: 20 | 30 days supply | Qty: 30 | Fill #0

## 2019-09-26 MED FILL — HYDROXYZINE PAM 50 MG CAP: 50 | 30 days supply | Qty: 90 | Fill #0

## 2019-09-28 DIAGNOSIS — F1021 Alcohol dependence, in remission: Secondary | ICD-10-CM | POA: Diagnosis not present

## 2019-09-28 DIAGNOSIS — F3131 Bipolar disorder, current episode depressed, mild: Secondary | ICD-10-CM | POA: Diagnosis not present

## 2019-09-28 MED FILL — VRAYLAR 3 MG CAPSULE: 3 | 30 days supply | Qty: 30 | Fill #0

## 2019-10-17 DIAGNOSIS — F1021 Alcohol dependence, in remission: Secondary | ICD-10-CM | POA: Diagnosis not present

## 2019-10-17 DIAGNOSIS — F3131 Bipolar disorder, current episode depressed, mild: Secondary | ICD-10-CM | POA: Diagnosis not present

## 2019-10-17 MED FILL — buPROPion HCL ER (XL) 150 M: 150 | 30 days supply | Qty: 30 | Fill #0

## 2019-10-24 MED FILL — ADDERALL XR 20 MG CAP SA: 20 | 30 days supply | Qty: 30 | Fill #0

## 2019-10-24 MED FILL — OMEPRAZOLE 20 MG CAP: 20 | 90 days supply | Qty: 90 | Fill #3

## 2019-10-29 ENCOUNTER — Other Ambulatory Visit (HOSPITAL_COMMUNITY): Payer: Self-pay | Admitting: *Deleted

## 2019-10-29 MED FILL — DULoxetine HCL 30 MG CPEP: 30 | 30 days supply | Qty: 60 | Fill #0

## 2019-11-13 DIAGNOSIS — F1021 Alcohol dependence, in remission: Secondary | ICD-10-CM | POA: Diagnosis not present

## 2019-11-13 DIAGNOSIS — F3131 Bipolar disorder, current episode depressed, mild: Secondary | ICD-10-CM | POA: Diagnosis not present

## 2019-11-19 ENCOUNTER — Other Ambulatory Visit (HOSPITAL_COMMUNITY): Payer: Self-pay | Admitting: *Deleted

## 2019-11-20 MED FILL — ADZENYS XR-ODT 15.7 MG TAB: 15.7 | 30 days supply | Qty: 30 | Fill #0

## 2019-11-20 MED FILL — ZOLPIDEM TARTRATE 10 MG TAB: 10 | 30 days supply | Qty: 30 | Fill #1

## 2019-12-04 ENCOUNTER — Other Ambulatory Visit (HOSPITAL_COMMUNITY): Payer: Self-pay | Admitting: *Deleted

## 2019-12-04 DIAGNOSIS — F3131 Bipolar disorder, current episode depressed, mild: Secondary | ICD-10-CM | POA: Diagnosis not present

## 2019-12-04 DIAGNOSIS — F1021 Alcohol dependence, in remission: Secondary | ICD-10-CM | POA: Diagnosis not present

## 2019-12-04 MED FILL — VRAYLAR 3 MG CAPSULE: 3 | 30 days supply | Qty: 30 | Fill #0

## 2019-12-04 MED FILL — HYDROXYZINE PAM 50 MG CAP: 50 | 30 days supply | Qty: 60 | Fill #0

## 2019-12-04 MED FILL — DULoxetine HCL 30 MG CPEP: 30 | 30 days supply | Qty: 90 | Fill #0

## 2019-12-04 MED FILL — VYVANSE 60 MG CAPSULE: 60 | 30 days supply | Qty: 30 | Fill #0

## 2019-12-14 MED FILL — ZOLPIDEM TARTRATE 10 MG TAB: 10 | 30 days supply | Qty: 30 | Fill #0

## 2019-12-17 DIAGNOSIS — F3131 Bipolar disorder, current episode depressed, mild: Secondary | ICD-10-CM | POA: Diagnosis not present

## 2019-12-17 DIAGNOSIS — D72829 Elevated white blood cell count, unspecified: Secondary | ICD-10-CM | POA: Diagnosis not present

## 2019-12-17 DIAGNOSIS — E538 Deficiency of other specified B group vitamins: Secondary | ICD-10-CM | POA: Diagnosis not present

## 2019-12-17 DIAGNOSIS — E559 Vitamin D deficiency, unspecified: Secondary | ICD-10-CM | POA: Diagnosis not present

## 2019-12-17 DIAGNOSIS — R7989 Other specified abnormal findings of blood chemistry: Secondary | ICD-10-CM | POA: Diagnosis not present

## 2019-12-17 DIAGNOSIS — Z7689 Persons encountering health services in other specified circumstances: Secondary | ICD-10-CM | POA: Diagnosis not present

## 2019-12-17 DIAGNOSIS — Z0000 Encounter for general adult medical examination without abnormal findings: Secondary | ICD-10-CM | POA: Diagnosis not present

## 2019-12-25 ENCOUNTER — Other Ambulatory Visit (HOSPITAL_COMMUNITY): Payer: Self-pay | Admitting: Internal Medicine

## 2019-12-25 DIAGNOSIS — L409 Psoriasis, unspecified: Secondary | ICD-10-CM | POA: Diagnosis not present

## 2019-12-25 DIAGNOSIS — F321 Major depressive disorder, single episode, moderate: Secondary | ICD-10-CM | POA: Diagnosis not present

## 2019-12-25 DIAGNOSIS — F319 Bipolar disorder, unspecified: Secondary | ICD-10-CM | POA: Diagnosis not present

## 2019-12-25 DIAGNOSIS — Z0000 Encounter for general adult medical examination without abnormal findings: Secondary | ICD-10-CM | POA: Diagnosis not present

## 2019-12-25 DIAGNOSIS — F900 Attention-deficit hyperactivity disorder, predominantly inattentive type: Secondary | ICD-10-CM | POA: Diagnosis not present

## 2019-12-25 DIAGNOSIS — E28319 Asymptomatic premature menopause: Secondary | ICD-10-CM | POA: Diagnosis not present

## 2019-12-25 DIAGNOSIS — Z1331 Encounter for screening for depression: Secondary | ICD-10-CM | POA: Diagnosis not present

## 2019-12-25 DIAGNOSIS — R82998 Other abnormal findings in urine: Secondary | ICD-10-CM | POA: Diagnosis not present

## 2019-12-25 DIAGNOSIS — N912 Amenorrhea, unspecified: Secondary | ICD-10-CM | POA: Diagnosis not present

## 2019-12-25 DIAGNOSIS — F308 Other manic episodes: Secondary | ICD-10-CM | POA: Diagnosis not present

## 2019-12-25 DIAGNOSIS — F1010 Alcohol abuse, uncomplicated: Secondary | ICD-10-CM | POA: Diagnosis not present

## 2019-12-25 MED FILL — valACYclovir HCL 1 GM TABS: 1 | 5 days supply | Qty: 20 | Fill #0

## 2020-01-01 MED FILL — DULoxetine HCL 30 MG CPEP: 30 | 30 days supply | Qty: 90 | Fill #1

## 2020-01-03 MED FILL — VYVANSE 60 MG CAPSULE: 60 | 30 days supply | Qty: 30 | Fill #0

## 2020-01-30 ENCOUNTER — Other Ambulatory Visit (HOSPITAL_COMMUNITY): Payer: Self-pay | Admitting: Internal Medicine

## 2020-01-30 MED FILL — CYCLOBENZAPRINE HCL 5 MG TA: 5 | 30 days supply | Qty: 90 | Fill #0

## 2020-01-30 MED FILL — OMEPRAZOLE 20 MG CAP: 20 | 90 days supply | Qty: 90 | Fill #0

## 2020-01-30 MED FILL — DULoxetine HCL 30 MG CPEP: 30 | 30 days supply | Qty: 90 | Fill #2

## 2020-02-06 ENCOUNTER — Other Ambulatory Visit (HOSPITAL_COMMUNITY): Payer: Self-pay | Admitting: *Deleted

## 2020-02-07 ENCOUNTER — Other Ambulatory Visit: Payer: Self-pay | Admitting: Internal Medicine

## 2020-02-07 DIAGNOSIS — R011 Cardiac murmur, unspecified: Secondary | ICD-10-CM

## 2020-02-07 MED FILL — VYVANSE 60 MG CAPSULE: 60 | 30 days supply | Qty: 30 | Fill #0

## 2020-02-18 MED FILL — ZOLPIDEM TARTRATE 10 MG TAB: 10 | 30 days supply | Qty: 30 | Fill #1

## 2020-03-04 ENCOUNTER — Other Ambulatory Visit (HOSPITAL_COMMUNITY): Payer: Self-pay | Admitting: *Deleted

## 2020-03-04 DIAGNOSIS — F1021 Alcohol dependence, in remission: Secondary | ICD-10-CM | POA: Diagnosis not present

## 2020-03-04 DIAGNOSIS — F3174 Bipolar disorder, in full remission, most recent episode manic: Secondary | ICD-10-CM | POA: Diagnosis not present

## 2020-03-04 MED FILL — DULoxetine HCL 30 MG CPEP: 30 | 30 days supply | Qty: 90 | Fill #0

## 2020-03-04 MED FILL — VRAYLAR 3 MG CAPSULE: 3 | 90 days supply | Qty: 90 | Fill #0

## 2020-03-04 MED FILL — HYDROXYZINE PAMOATE 50 MG C: 50 | 30 days supply | Qty: 60 | Fill #0

## 2020-03-06 MED FILL — VYVANSE 60 MG CAPSULE: 60 | 30 days supply | Qty: 30 | Fill #0

## 2020-03-07 ENCOUNTER — Other Ambulatory Visit (HOSPITAL_COMMUNITY): Payer: Self-pay | Admitting: Internal Medicine

## 2020-03-07 MED FILL — IBUPROFEN 800 MG TAB: 800 | 30 days supply | Qty: 90 | Fill #0

## 2020-03-31 MED FILL — DULoxetine HCL 30 MG CPEP: 30 | 30 days supply | Qty: 90 | Fill #1

## 2020-04-16 ENCOUNTER — Other Ambulatory Visit (HOSPITAL_BASED_OUTPATIENT_CLINIC_OR_DEPARTMENT_OTHER): Payer: Self-pay

## 2020-04-16 MED FILL — VYVANSE 60 MG CAPSULE: 60 | 30 days supply | Qty: 30 | Fill #0

## 2020-04-29 ENCOUNTER — Other Ambulatory Visit (HOSPITAL_COMMUNITY): Payer: Self-pay

## 2020-04-29 MED FILL — Valacyclovir HCl Tab 1 GM: ORAL | 5 days supply | Qty: 20 | Fill #0 | Status: AC

## 2020-04-29 MED FILL — Zolpidem Tartrate Tab 10 MG: ORAL | 30 days supply | Qty: 30 | Fill #0 | Status: AC

## 2020-04-29 MED FILL — Omeprazole Cap Delayed Release 20 MG: ORAL | 90 days supply | Qty: 90 | Fill #0 | Status: AC

## 2020-05-01 ENCOUNTER — Other Ambulatory Visit (HOSPITAL_COMMUNITY): Payer: Self-pay

## 2020-05-01 MED FILL — Duloxetine HCl Enteric Coated Pellets Cap 30 MG (Base Eq): ORAL | 30 days supply | Qty: 90 | Fill #0 | Status: AC

## 2020-05-02 ENCOUNTER — Other Ambulatory Visit (HOSPITAL_COMMUNITY): Payer: Self-pay

## 2020-05-06 ENCOUNTER — Other Ambulatory Visit (HOSPITAL_COMMUNITY): Payer: Self-pay

## 2020-05-19 ENCOUNTER — Other Ambulatory Visit (HOSPITAL_COMMUNITY): Payer: Self-pay

## 2020-05-22 ENCOUNTER — Other Ambulatory Visit (HOSPITAL_COMMUNITY): Payer: Self-pay

## 2020-05-23 ENCOUNTER — Other Ambulatory Visit (HOSPITAL_COMMUNITY): Payer: Self-pay

## 2020-05-24 ENCOUNTER — Other Ambulatory Visit (HOSPITAL_COMMUNITY): Payer: Self-pay

## 2020-05-24 MED ORDER — VYVANSE 60 MG PO CAPS
ORAL_CAPSULE | ORAL | 0 refills | Status: AC
  Filled 2020-05-24: qty 30, 30d supply, fill #0

## 2020-06-24 ENCOUNTER — Other Ambulatory Visit (HOSPITAL_COMMUNITY): Payer: Self-pay

## 2020-06-24 MED ORDER — DULOXETINE HCL 30 MG PO CPEP
ORAL_CAPSULE | ORAL | 2 refills | Status: AC
  Filled 2020-06-24: qty 90, 30d supply, fill #0

## 2020-06-25 ENCOUNTER — Other Ambulatory Visit (HOSPITAL_COMMUNITY): Payer: Self-pay

## 2020-06-25 MED ORDER — VYVANSE 60 MG PO CAPS
ORAL_CAPSULE | ORAL | 0 refills | Status: DC
  Filled 2020-06-25: qty 30, 30d supply, fill #0

## 2020-06-26 ENCOUNTER — Other Ambulatory Visit (HOSPITAL_COMMUNITY): Payer: Self-pay

## 2020-07-15 ENCOUNTER — Other Ambulatory Visit (HOSPITAL_COMMUNITY): Payer: Self-pay

## 2020-07-16 ENCOUNTER — Other Ambulatory Visit (HOSPITAL_COMMUNITY): Payer: Self-pay

## 2020-07-17 ENCOUNTER — Other Ambulatory Visit (HOSPITAL_COMMUNITY): Payer: Self-pay

## 2020-07-17 MED ORDER — VYVANSE 60 MG PO CAPS
ORAL_CAPSULE | ORAL | 0 refills | Status: DC
  Filled 2020-07-23: qty 30, 30d supply, fill #0

## 2020-07-18 ENCOUNTER — Other Ambulatory Visit (HOSPITAL_COMMUNITY): Payer: Self-pay

## 2020-07-18 MED FILL — Zolpidem Tartrate Tab 10 MG: ORAL | 90 days supply | Qty: 90 | Fill #0 | Status: AC

## 2020-07-21 ENCOUNTER — Other Ambulatory Visit (HOSPITAL_COMMUNITY): Payer: Self-pay

## 2020-07-22 ENCOUNTER — Other Ambulatory Visit (HOSPITAL_COMMUNITY): Payer: Self-pay

## 2020-07-22 MED FILL — Omeprazole Cap Delayed Release 20 MG: ORAL | 90 days supply | Qty: 90 | Fill #1 | Status: AC

## 2020-07-23 ENCOUNTER — Other Ambulatory Visit (HOSPITAL_COMMUNITY): Payer: Self-pay

## 2020-07-24 DIAGNOSIS — R531 Weakness: Secondary | ICD-10-CM | POA: Diagnosis not present

## 2020-07-24 DIAGNOSIS — M2569 Stiffness of other specified joint, not elsewhere classified: Secondary | ICD-10-CM | POA: Diagnosis not present

## 2020-07-24 DIAGNOSIS — R293 Abnormal posture: Secondary | ICD-10-CM | POA: Diagnosis not present

## 2020-07-24 DIAGNOSIS — M542 Cervicalgia: Secondary | ICD-10-CM | POA: Diagnosis not present

## 2020-07-29 ENCOUNTER — Other Ambulatory Visit (HOSPITAL_COMMUNITY): Payer: Self-pay

## 2020-07-29 DIAGNOSIS — M542 Cervicalgia: Secondary | ICD-10-CM | POA: Diagnosis not present

## 2020-07-29 DIAGNOSIS — R293 Abnormal posture: Secondary | ICD-10-CM | POA: Diagnosis not present

## 2020-07-29 DIAGNOSIS — R531 Weakness: Secondary | ICD-10-CM | POA: Diagnosis not present

## 2020-07-29 DIAGNOSIS — M2569 Stiffness of other specified joint, not elsewhere classified: Secondary | ICD-10-CM | POA: Diagnosis not present

## 2020-07-30 ENCOUNTER — Other Ambulatory Visit (HOSPITAL_COMMUNITY): Payer: Self-pay

## 2020-07-31 DIAGNOSIS — M542 Cervicalgia: Secondary | ICD-10-CM | POA: Diagnosis not present

## 2020-07-31 DIAGNOSIS — R531 Weakness: Secondary | ICD-10-CM | POA: Diagnosis not present

## 2020-07-31 DIAGNOSIS — R293 Abnormal posture: Secondary | ICD-10-CM | POA: Diagnosis not present

## 2020-07-31 DIAGNOSIS — M2569 Stiffness of other specified joint, not elsewhere classified: Secondary | ICD-10-CM | POA: Diagnosis not present

## 2020-08-04 ENCOUNTER — Other Ambulatory Visit (HOSPITAL_COMMUNITY): Payer: Self-pay

## 2020-08-05 ENCOUNTER — Other Ambulatory Visit (HOSPITAL_COMMUNITY): Payer: Self-pay

## 2020-08-06 ENCOUNTER — Other Ambulatory Visit (HOSPITAL_COMMUNITY): Payer: Self-pay

## 2020-08-06 DIAGNOSIS — M542 Cervicalgia: Secondary | ICD-10-CM | POA: Diagnosis not present

## 2020-08-06 DIAGNOSIS — M2569 Stiffness of other specified joint, not elsewhere classified: Secondary | ICD-10-CM | POA: Diagnosis not present

## 2020-08-06 DIAGNOSIS — R293 Abnormal posture: Secondary | ICD-10-CM | POA: Diagnosis not present

## 2020-08-06 DIAGNOSIS — R531 Weakness: Secondary | ICD-10-CM | POA: Diagnosis not present

## 2020-08-07 ENCOUNTER — Other Ambulatory Visit (HOSPITAL_COMMUNITY): Payer: Self-pay

## 2020-08-07 DIAGNOSIS — M542 Cervicalgia: Secondary | ICD-10-CM | POA: Diagnosis not present

## 2020-08-07 DIAGNOSIS — R531 Weakness: Secondary | ICD-10-CM | POA: Diagnosis not present

## 2020-08-07 DIAGNOSIS — M2569 Stiffness of other specified joint, not elsewhere classified: Secondary | ICD-10-CM | POA: Diagnosis not present

## 2020-08-07 DIAGNOSIS — R293 Abnormal posture: Secondary | ICD-10-CM | POA: Diagnosis not present

## 2020-08-08 ENCOUNTER — Other Ambulatory Visit (HOSPITAL_COMMUNITY): Payer: Self-pay

## 2020-08-11 DIAGNOSIS — M542 Cervicalgia: Secondary | ICD-10-CM | POA: Diagnosis not present

## 2020-08-11 DIAGNOSIS — R531 Weakness: Secondary | ICD-10-CM | POA: Diagnosis not present

## 2020-08-11 DIAGNOSIS — R293 Abnormal posture: Secondary | ICD-10-CM | POA: Diagnosis not present

## 2020-08-11 DIAGNOSIS — M2569 Stiffness of other specified joint, not elsewhere classified: Secondary | ICD-10-CM | POA: Diagnosis not present

## 2020-08-12 ENCOUNTER — Other Ambulatory Visit (HOSPITAL_COMMUNITY): Payer: Self-pay

## 2020-08-13 ENCOUNTER — Other Ambulatory Visit (HOSPITAL_COMMUNITY): Payer: Self-pay

## 2020-08-13 DIAGNOSIS — M2569 Stiffness of other specified joint, not elsewhere classified: Secondary | ICD-10-CM | POA: Diagnosis not present

## 2020-08-13 DIAGNOSIS — R531 Weakness: Secondary | ICD-10-CM | POA: Diagnosis not present

## 2020-08-13 DIAGNOSIS — R293 Abnormal posture: Secondary | ICD-10-CM | POA: Diagnosis not present

## 2020-08-13 DIAGNOSIS — M542 Cervicalgia: Secondary | ICD-10-CM | POA: Diagnosis not present

## 2020-08-13 MED ORDER — VYVANSE 60 MG PO CAPS
60.0000 mg | ORAL_CAPSULE | Freq: Every morning | ORAL | 0 refills | Status: AC
  Filled 2020-09-08: qty 30, 30d supply, fill #0

## 2020-08-14 ENCOUNTER — Other Ambulatory Visit (HOSPITAL_COMMUNITY): Payer: Self-pay

## 2020-08-14 MED FILL — Valacyclovir HCl Tab 1 GM: ORAL | 5 days supply | Qty: 20 | Fill #1 | Status: AC

## 2020-08-26 DIAGNOSIS — F3174 Bipolar disorder, in full remission, most recent episode manic: Secondary | ICD-10-CM | POA: Diagnosis not present

## 2020-08-26 DIAGNOSIS — F1021 Alcohol dependence, in remission: Secondary | ICD-10-CM | POA: Diagnosis not present

## 2020-09-02 ENCOUNTER — Other Ambulatory Visit (HOSPITAL_COMMUNITY): Payer: Self-pay

## 2020-09-02 DIAGNOSIS — F3174 Bipolar disorder, in full remission, most recent episode manic: Secondary | ICD-10-CM | POA: Diagnosis not present

## 2020-09-02 DIAGNOSIS — F1021 Alcohol dependence, in remission: Secondary | ICD-10-CM | POA: Diagnosis not present

## 2020-09-02 MED ORDER — HYDROXYZINE PAMOATE 50 MG PO CAPS
ORAL_CAPSULE | ORAL | 0 refills | Status: AC
  Filled 2020-09-02: qty 180, 90d supply, fill #0

## 2020-09-02 MED ORDER — ZOLPIDEM TARTRATE 10 MG PO TABS
ORAL_TABLET | ORAL | 1 refills | Status: AC
  Filled 2020-09-02: qty 90, 90d supply, fill #0

## 2020-09-02 MED ORDER — VYVANSE 60 MG PO CAPS
ORAL_CAPSULE | ORAL | 0 refills | Status: AC
  Filled 2020-11-26: qty 30, 30d supply, fill #0

## 2020-09-02 MED ORDER — VYVANSE 60 MG PO CAPS
ORAL_CAPSULE | ORAL | 0 refills | Status: AC
  Filled 2020-10-20: qty 30, 30d supply, fill #0

## 2020-09-02 MED ORDER — DULOXETINE HCL 30 MG PO CPEP
ORAL_CAPSULE | ORAL | 1 refills | Status: DC
  Filled 2020-09-02: qty 90, 90d supply, fill #0
  Filled 2020-12-22: qty 90, 90d supply, fill #1

## 2020-09-02 MED ORDER — VYVANSE 60 MG PO CAPS
ORAL_CAPSULE | ORAL | 0 refills | Status: AC
  Filled 2020-12-23: qty 30, 30d supply, fill #0

## 2020-09-03 ENCOUNTER — Other Ambulatory Visit (HOSPITAL_COMMUNITY): Payer: Self-pay

## 2020-09-08 ENCOUNTER — Other Ambulatory Visit (HOSPITAL_COMMUNITY): Payer: Self-pay

## 2020-09-09 DIAGNOSIS — F1021 Alcohol dependence, in remission: Secondary | ICD-10-CM | POA: Diagnosis not present

## 2020-09-09 DIAGNOSIS — F3174 Bipolar disorder, in full remission, most recent episode manic: Secondary | ICD-10-CM | POA: Diagnosis not present

## 2020-09-17 DIAGNOSIS — F3174 Bipolar disorder, in full remission, most recent episode manic: Secondary | ICD-10-CM | POA: Diagnosis not present

## 2020-09-17 DIAGNOSIS — F1021 Alcohol dependence, in remission: Secondary | ICD-10-CM | POA: Diagnosis not present

## 2020-09-26 DIAGNOSIS — F1021 Alcohol dependence, in remission: Secondary | ICD-10-CM | POA: Diagnosis not present

## 2020-09-26 DIAGNOSIS — F3174 Bipolar disorder, in full remission, most recent episode manic: Secondary | ICD-10-CM | POA: Diagnosis not present

## 2020-10-14 ENCOUNTER — Other Ambulatory Visit (HOSPITAL_COMMUNITY): Payer: Self-pay

## 2020-10-14 DIAGNOSIS — E28310 Symptomatic premature menopause: Secondary | ICD-10-CM | POA: Diagnosis not present

## 2020-10-14 DIAGNOSIS — Z79890 Hormone replacement therapy (postmenopausal): Secondary | ICD-10-CM | POA: Diagnosis not present

## 2020-10-15 ENCOUNTER — Other Ambulatory Visit (HOSPITAL_COMMUNITY): Payer: Self-pay

## 2020-10-15 MED ORDER — BIJUVA 1-100 MG PO CAPS
ORAL_CAPSULE | ORAL | 0 refills | Status: DC
  Filled 2020-10-15: qty 90, 90d supply, fill #0

## 2020-10-17 ENCOUNTER — Other Ambulatory Visit (HOSPITAL_COMMUNITY): Payer: Self-pay

## 2020-10-17 MED ORDER — DUAVEE 0.45-20 MG PO TABS
ORAL_TABLET | ORAL | 1 refills | Status: AC
  Filled 2020-10-17: qty 90, 90d supply, fill #0

## 2020-10-20 ENCOUNTER — Other Ambulatory Visit (HOSPITAL_COMMUNITY): Payer: Self-pay

## 2020-10-20 DIAGNOSIS — F3174 Bipolar disorder, in full remission, most recent episode manic: Secondary | ICD-10-CM | POA: Diagnosis not present

## 2020-10-20 DIAGNOSIS — F1021 Alcohol dependence, in remission: Secondary | ICD-10-CM | POA: Diagnosis not present

## 2020-10-20 MED FILL — Omeprazole Cap Delayed Release 20 MG: ORAL | 90 days supply | Qty: 90 | Fill #2 | Status: AC

## 2020-10-21 ENCOUNTER — Other Ambulatory Visit (HOSPITAL_COMMUNITY): Payer: Self-pay

## 2020-10-24 ENCOUNTER — Other Ambulatory Visit (HOSPITAL_COMMUNITY): Payer: Self-pay

## 2020-10-24 MED ORDER — PROGESTERONE MICRONIZED 100 MG PO CAPS
100.0000 mg | ORAL_CAPSULE | Freq: Every evening | ORAL | 3 refills | Status: AC
  Filled 2020-10-24: qty 90, 90d supply, fill #0
  Filled 2021-01-22: qty 90, 90d supply, fill #1
  Filled 2021-07-06: qty 90, 90d supply, fill #2

## 2020-10-24 MED ORDER — ESTRADIOL 0.1 MG/24HR TD PTTW
MEDICATED_PATCH | TRANSDERMAL | 3 refills | Status: AC
  Filled 2020-10-24: qty 24, 84d supply, fill #0
  Filled 2021-01-08: qty 24, 84d supply, fill #1
  Filled 2021-04-02: qty 24, 84d supply, fill #2
  Filled 2021-07-30: qty 24, 84d supply, fill #3

## 2020-10-27 ENCOUNTER — Other Ambulatory Visit (HOSPITAL_COMMUNITY): Payer: Self-pay

## 2020-10-28 DIAGNOSIS — F3174 Bipolar disorder, in full remission, most recent episode manic: Secondary | ICD-10-CM | POA: Diagnosis not present

## 2020-10-28 DIAGNOSIS — F1021 Alcohol dependence, in remission: Secondary | ICD-10-CM | POA: Diagnosis not present

## 2020-11-10 DIAGNOSIS — F3174 Bipolar disorder, in full remission, most recent episode manic: Secondary | ICD-10-CM | POA: Diagnosis not present

## 2020-11-10 DIAGNOSIS — F1021 Alcohol dependence, in remission: Secondary | ICD-10-CM | POA: Diagnosis not present

## 2020-11-18 DIAGNOSIS — F1021 Alcohol dependence, in remission: Secondary | ICD-10-CM | POA: Diagnosis not present

## 2020-11-18 DIAGNOSIS — F3174 Bipolar disorder, in full remission, most recent episode manic: Secondary | ICD-10-CM | POA: Diagnosis not present

## 2020-11-26 ENCOUNTER — Other Ambulatory Visit (HOSPITAL_COMMUNITY): Payer: Self-pay

## 2020-12-01 ENCOUNTER — Other Ambulatory Visit (HOSPITAL_COMMUNITY): Payer: Self-pay

## 2020-12-01 MED ORDER — LAMOTRIGINE 25 MG PO TABS
ORAL_TABLET | ORAL | 0 refills | Status: AC
  Filled 2020-12-01: qty 120, 47d supply, fill #0

## 2020-12-02 DIAGNOSIS — F3174 Bipolar disorder, in full remission, most recent episode manic: Secondary | ICD-10-CM | POA: Diagnosis not present

## 2020-12-02 DIAGNOSIS — F1021 Alcohol dependence, in remission: Secondary | ICD-10-CM | POA: Diagnosis not present

## 2020-12-03 ENCOUNTER — Other Ambulatory Visit (HOSPITAL_COMMUNITY): Payer: Self-pay

## 2020-12-03 DIAGNOSIS — Z1231 Encounter for screening mammogram for malignant neoplasm of breast: Secondary | ICD-10-CM | POA: Diagnosis not present

## 2020-12-03 DIAGNOSIS — Z79890 Hormone replacement therapy (postmenopausal): Secondary | ICD-10-CM | POA: Diagnosis not present

## 2020-12-03 MED ORDER — ESTRADIOL 0.1 MG/24HR TD PTTW
MEDICATED_PATCH | TRANSDERMAL | 3 refills | Status: DC
  Filled 2020-12-03 – 2021-08-08 (×2): qty 24, 84d supply, fill #0

## 2020-12-03 MED ORDER — PROGESTERONE MICRONIZED 100 MG PO CAPS
100.0000 mg | ORAL_CAPSULE | Freq: Every evening | ORAL | 3 refills | Status: AC
  Filled 2020-12-03: qty 90, 90d supply, fill #0

## 2020-12-22 ENCOUNTER — Other Ambulatory Visit (HOSPITAL_COMMUNITY): Payer: Self-pay

## 2020-12-22 MED FILL — Ibuprofen Tab 800 MG: ORAL | 30 days supply | Qty: 90 | Fill #0 | Status: AC

## 2020-12-23 ENCOUNTER — Other Ambulatory Visit (HOSPITAL_COMMUNITY): Payer: Self-pay

## 2020-12-23 DIAGNOSIS — F3174 Bipolar disorder, in full remission, most recent episode manic: Secondary | ICD-10-CM | POA: Diagnosis not present

## 2020-12-23 DIAGNOSIS — F1021 Alcohol dependence, in remission: Secondary | ICD-10-CM | POA: Diagnosis not present

## 2021-01-08 ENCOUNTER — Other Ambulatory Visit (HOSPITAL_COMMUNITY): Payer: Self-pay

## 2021-01-21 IMAGING — MR MR LUMBAR SPINE W/O CM
4 of 5 series · 25 of 48 positions shown · non-contrast
Comparison: None.

CLINICAL DATA: Lower thoracic and upper lumbar pain.

EXAM:
MRI LUMBAR SPINE WITHOUT CONTRAST
TECHNIQUE: Multiplanar, multisequence MR imaging of the lumbar spine was
performed. No intravenous contrast was administered.

[Series 2: T2 · sagittal · 4.0mm · 0.55mm/px · 6 of 15 slices shown (1 of 2)]
[im 1/15]
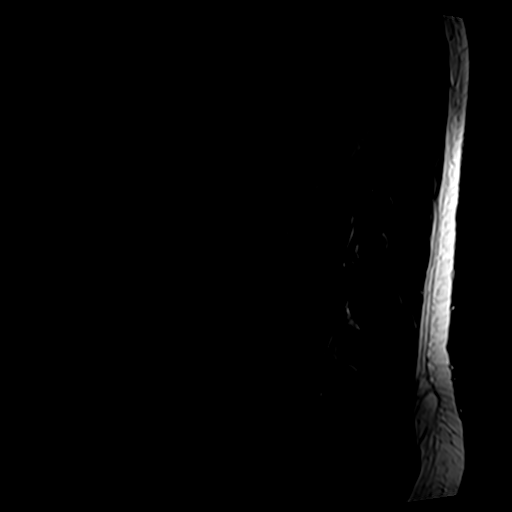
[im 3/15]
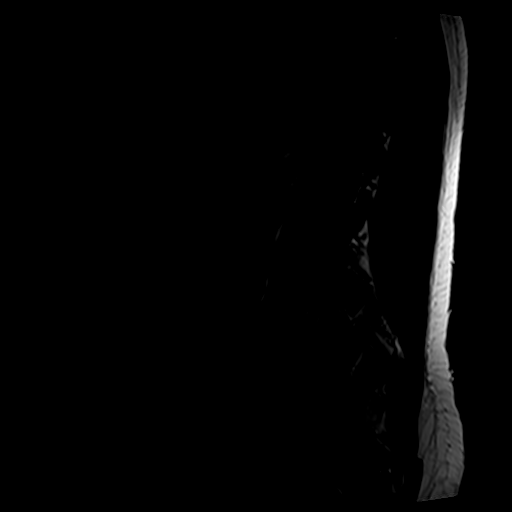
[im 6/15]
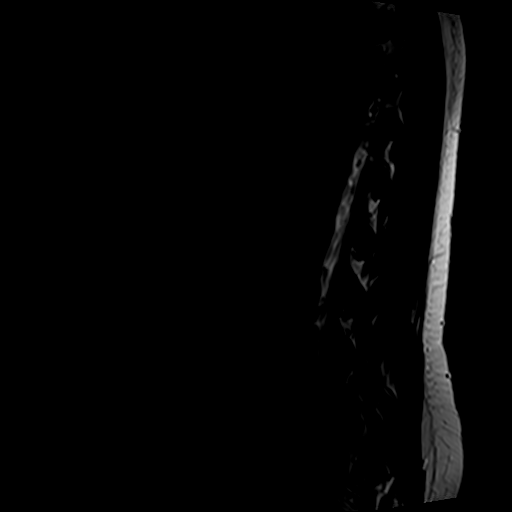
[im 9/15]
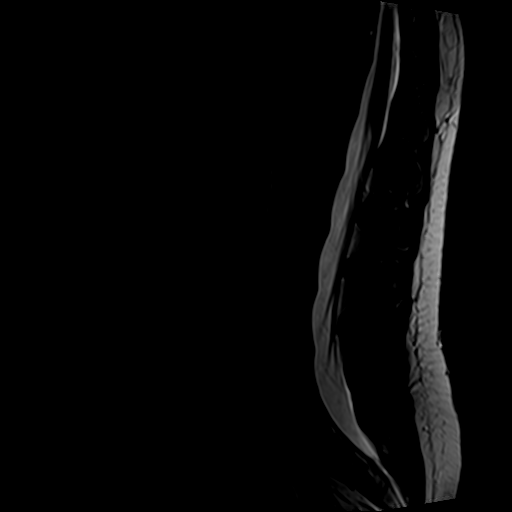
[im 12/15]
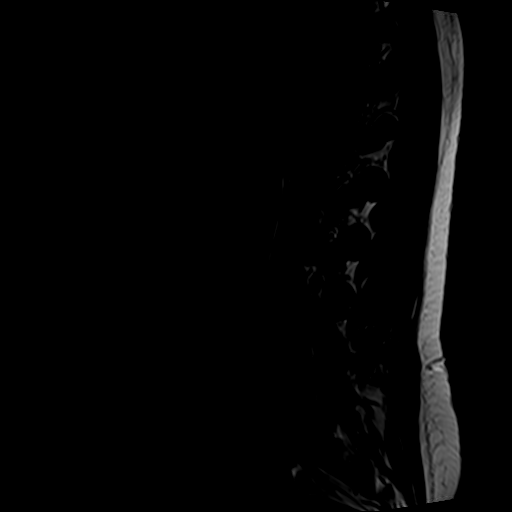
[im 15/15]
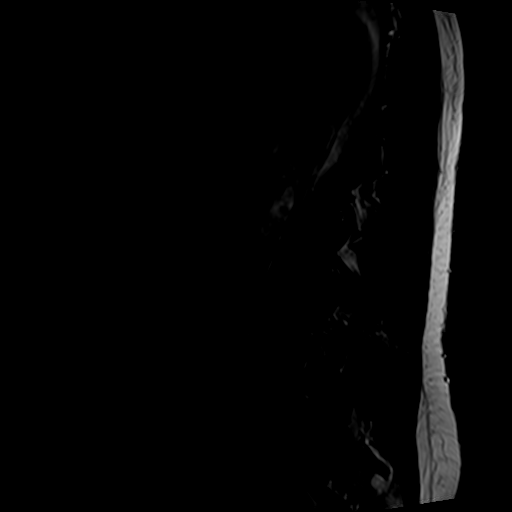

[Series 4: T1 · sagittal · 4.0mm · 0.55mm/px · 6 of 15 slices shown (1 of 2)]
[im 1/15]
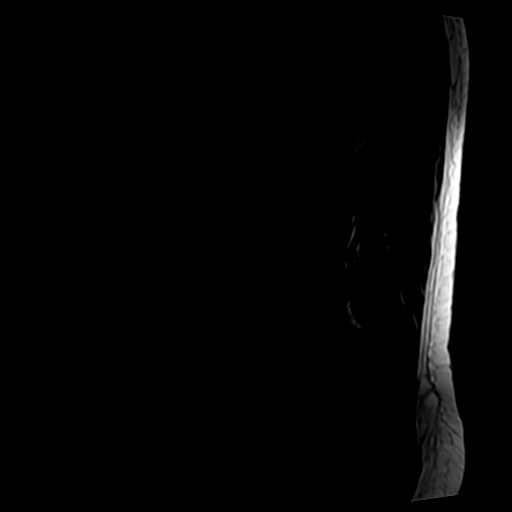
[im 3/15]
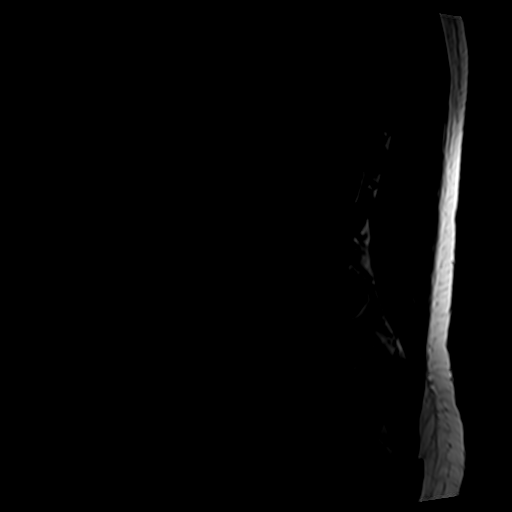
[im 6/15]
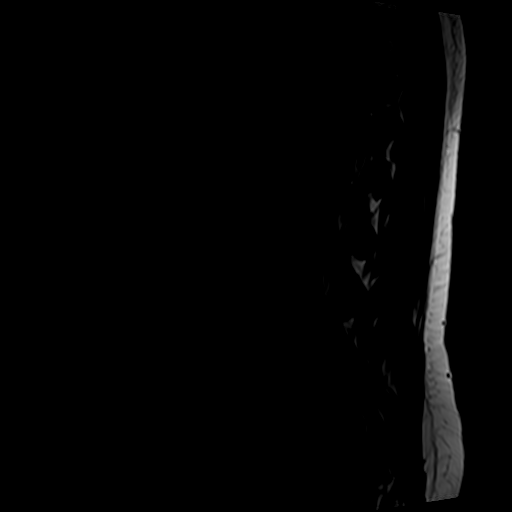
[im 9/15]
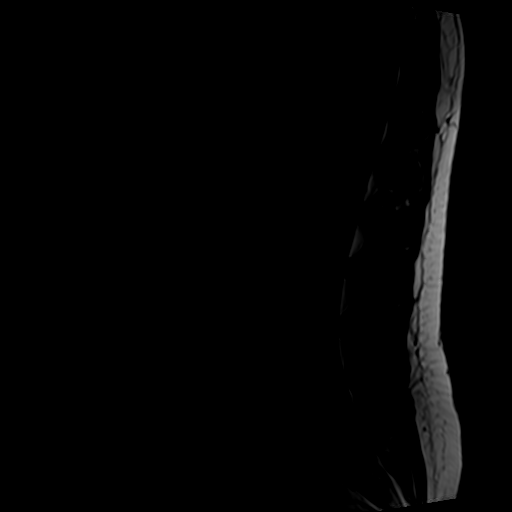
[im 12/15]
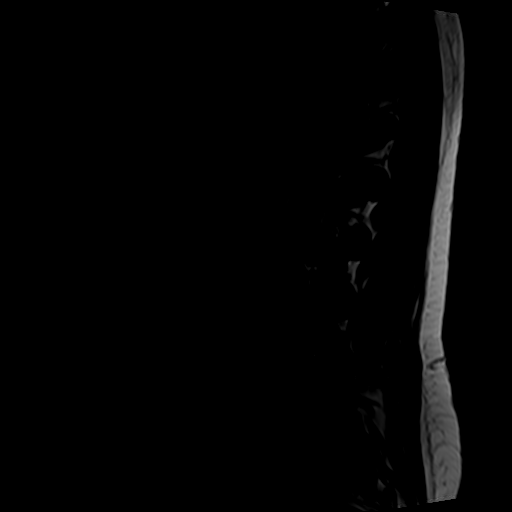
[im 15/15]
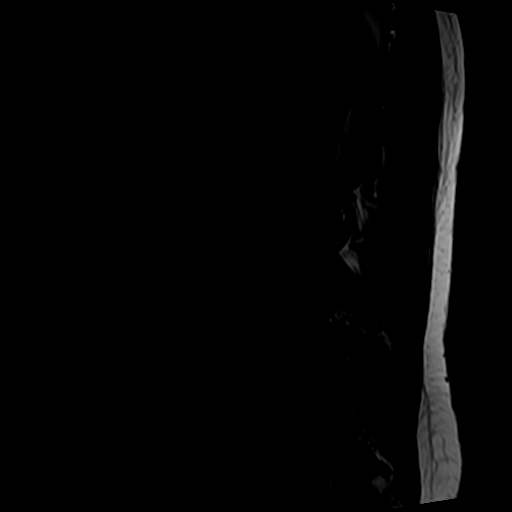

[Series 5: T2 · axial · 4.0mm · 0.70mm/px · z∈[-114,+91]mm · 9 of 36 slices shown (2 of 2)]
[im 1/36]
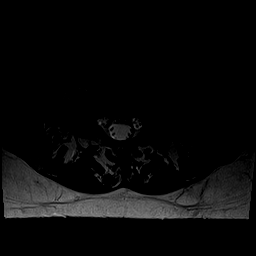
[im 6/36]
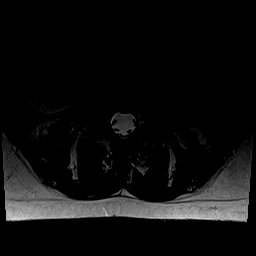
[im 11/36]
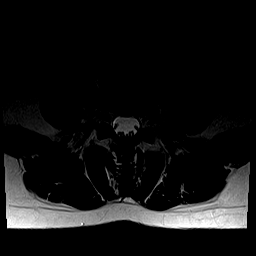
[im 16/36]
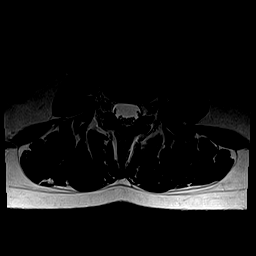
[im 18/36]
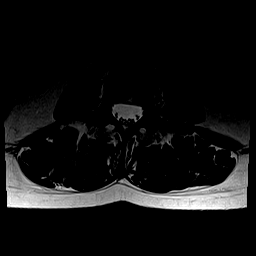
[im 21/36]
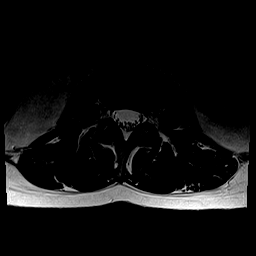
[im 26/36]
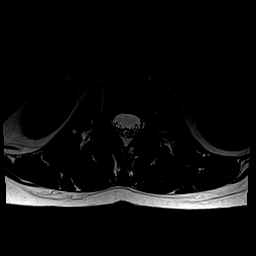
[im 31/36]
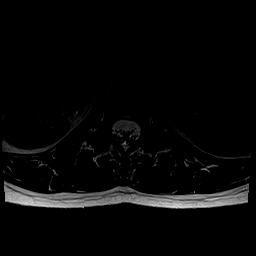
[im 36/36]
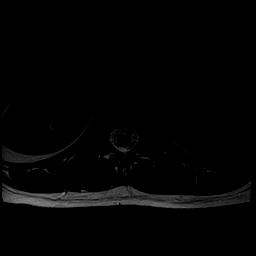

[Series 6: T1 · axial · 4.0mm · 0.35mm/px · z∈[-114,+65]mm · 4 of 36 slices shown (2 of 2)]
[im 1/36]
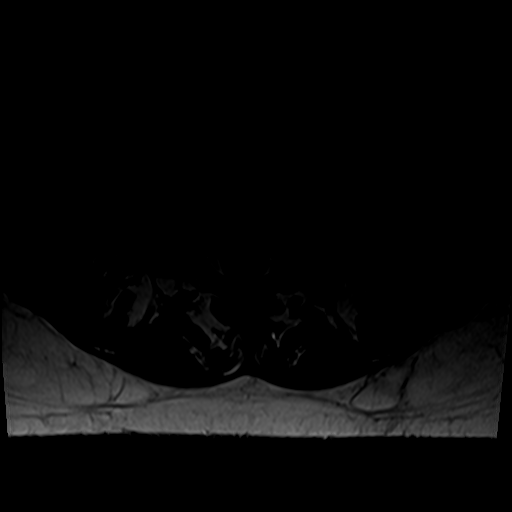
[im 6/36]
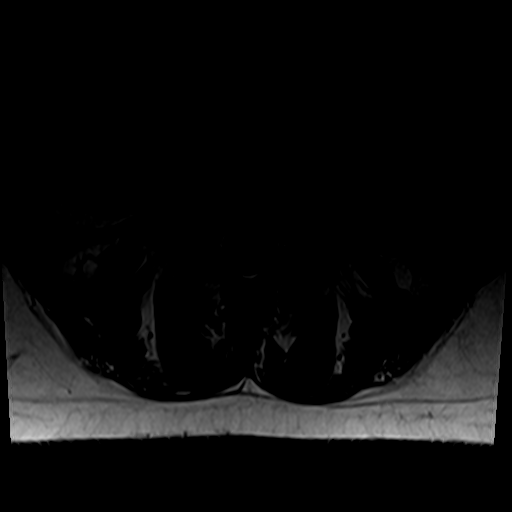
[im 18/36]
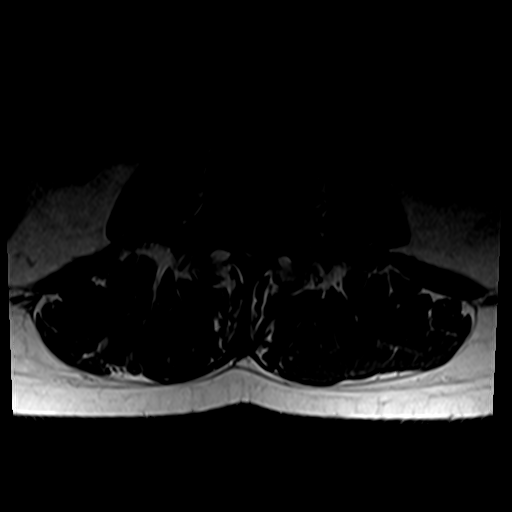
[im 31/36]
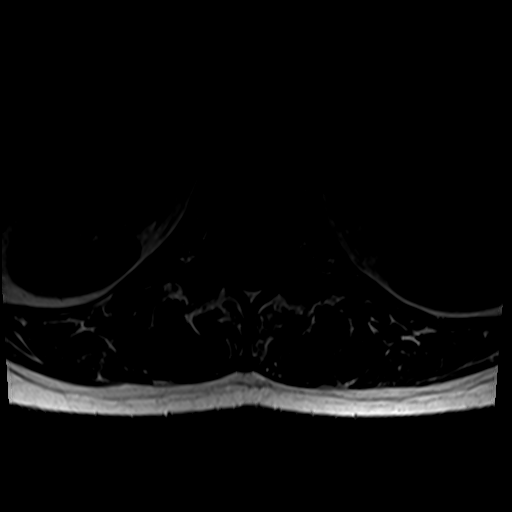

[25 of 48 positions shown; findings below may reference images not displayed]

FINDINGS: Segmentation:  Standard.

Alignment:  Physiologic.

Vertebrae:  No fracture, evidence of discitis, or bone lesion.

Conus medullaris and cauda equina: Conus extends to the L1 level.
Conus and cauda equina appear normal.

Paraspinal and other soft tissues: No acute paraspinal abnormality.

Disc levels:

Disc spaces: Disc spaces are preserved.

T12-L1: No significant disc bulge. No evidence of neural foraminal
stenosis. No central canal stenosis.

L1-L2: No significant disc bulge. No evidence of neural foraminal
stenosis. No central canal stenosis.

L2-L3: No significant disc bulge. No evidence of neural foraminal
stenosis. No central canal stenosis.

L3-L4: No significant disc bulge. No evidence of neural foraminal
stenosis. No central canal stenosis.

L4-L5: Small shallow right lateral disc protrusion. No evidence of
neural foraminal stenosis. No central canal stenosis.

L5-S1: No significant disc bulge. No evidence of neural foraminal
stenosis. No central canal stenosis. Mild left facet arthropathy.
IMPRESSION: 1. At L4-5 there is a small shallow right lateral disc protrusion.
2. At L5-S1 there is mild left facet arthropathy.

## 2021-01-22 ENCOUNTER — Other Ambulatory Visit (HOSPITAL_COMMUNITY): Payer: Self-pay

## 2021-01-22 MED ORDER — LAMOTRIGINE 25 MG PO TABS
ORAL_TABLET | ORAL | 0 refills | Status: AC
  Filled 2021-01-22: qty 120, 47d supply, fill #0

## 2021-01-27 DIAGNOSIS — F3174 Bipolar disorder, in full remission, most recent episode manic: Secondary | ICD-10-CM | POA: Diagnosis not present

## 2021-01-27 DIAGNOSIS — F1021 Alcohol dependence, in remission: Secondary | ICD-10-CM | POA: Diagnosis not present

## 2021-01-28 ENCOUNTER — Other Ambulatory Visit (HOSPITAL_COMMUNITY): Payer: Self-pay

## 2021-01-29 ENCOUNTER — Other Ambulatory Visit (HOSPITAL_COMMUNITY): Payer: Self-pay

## 2021-01-29 MED ORDER — VYVANSE 60 MG PO CAPS
ORAL_CAPSULE | ORAL | 0 refills | Status: DC
  Filled 2021-01-29: qty 30, 30d supply, fill #0

## 2021-01-31 ENCOUNTER — Other Ambulatory Visit (HOSPITAL_COMMUNITY): Payer: Self-pay

## 2021-02-03 ENCOUNTER — Other Ambulatory Visit (HOSPITAL_COMMUNITY): Payer: Self-pay

## 2021-02-03 DIAGNOSIS — F1021 Alcohol dependence, in remission: Secondary | ICD-10-CM | POA: Diagnosis not present

## 2021-02-03 DIAGNOSIS — F3174 Bipolar disorder, in full remission, most recent episode manic: Secondary | ICD-10-CM | POA: Diagnosis not present

## 2021-02-03 DIAGNOSIS — F902 Attention-deficit hyperactivity disorder, combined type: Secondary | ICD-10-CM | POA: Diagnosis not present

## 2021-02-03 MED ORDER — FLUOXETINE HCL 20 MG PO CAPS
ORAL_CAPSULE | ORAL | 1 refills | Status: AC
  Filled 2021-02-03: qty 90, 90d supply, fill #0

## 2021-02-03 MED ORDER — AMPHETAMINE-DEXTROAMPHET ER 20 MG PO CP24
20.0000 mg | ORAL_CAPSULE | Freq: Every day | ORAL | 0 refills | Status: DC
Start: 2021-04-03 — End: 2021-05-21
  Filled 2021-04-11: qty 30, 30d supply, fill #0

## 2021-02-03 MED ORDER — AMPHETAMINE-DEXTROAMPHET ER 20 MG PO CP24
ORAL_CAPSULE | ORAL | 0 refills | Status: DC
  Filled 2021-03-06: qty 30, 30d supply, fill #0

## 2021-02-03 MED ORDER — AMPHETAMINE-DEXTROAMPHET ER 20 MG PO CP24
ORAL_CAPSULE | ORAL | 0 refills | Status: AC
  Filled 2021-02-03: qty 30, 30d supply, fill #0

## 2021-02-03 MED ORDER — LAMOTRIGINE 150 MG PO TABS
ORAL_TABLET | ORAL | 0 refills | Status: AC
  Filled 2021-02-03: qty 90, 90d supply, fill #0

## 2021-02-03 MED ORDER — LAMOTRIGINE 150 MG PO TABS: ORAL_TABLET | ORAL | 1 refills | Status: AC

## 2021-02-04 ENCOUNTER — Other Ambulatory Visit (HOSPITAL_COMMUNITY): Payer: Self-pay

## 2021-02-04 MED ORDER — OMEPRAZOLE 20 MG PO CPDR
DELAYED_RELEASE_CAPSULE | ORAL | 0 refills | Status: DC
  Filled 2021-02-04: qty 90, 90d supply, fill #0

## 2021-02-05 ENCOUNTER — Other Ambulatory Visit (HOSPITAL_COMMUNITY): Payer: Self-pay

## 2021-02-24 DIAGNOSIS — F1021 Alcohol dependence, in remission: Secondary | ICD-10-CM | POA: Diagnosis not present

## 2021-02-24 DIAGNOSIS — F902 Attention-deficit hyperactivity disorder, combined type: Secondary | ICD-10-CM | POA: Diagnosis not present

## 2021-02-24 DIAGNOSIS — F3174 Bipolar disorder, in full remission, most recent episode manic: Secondary | ICD-10-CM | POA: Diagnosis not present

## 2021-03-02 DIAGNOSIS — R002 Palpitations: Secondary | ICD-10-CM | POA: Diagnosis not present

## 2021-03-02 DIAGNOSIS — E538 Deficiency of other specified B group vitamins: Secondary | ICD-10-CM | POA: Diagnosis not present

## 2021-03-02 DIAGNOSIS — E28319 Asymptomatic premature menopause: Secondary | ICD-10-CM | POA: Diagnosis not present

## 2021-03-02 DIAGNOSIS — E785 Hyperlipidemia, unspecified: Secondary | ICD-10-CM | POA: Diagnosis not present

## 2021-03-02 DIAGNOSIS — E559 Vitamin D deficiency, unspecified: Secondary | ICD-10-CM | POA: Diagnosis not present

## 2021-03-03 ENCOUNTER — Other Ambulatory Visit (HOSPITAL_COMMUNITY): Payer: Self-pay

## 2021-03-05 ENCOUNTER — Other Ambulatory Visit (HOSPITAL_COMMUNITY): Payer: Self-pay

## 2021-03-05 DIAGNOSIS — R011 Cardiac murmur, unspecified: Secondary | ICD-10-CM | POA: Diagnosis not present

## 2021-03-05 DIAGNOSIS — Z1331 Encounter for screening for depression: Secondary | ICD-10-CM | POA: Diagnosis not present

## 2021-03-05 DIAGNOSIS — F321 Major depressive disorder, single episode, moderate: Secondary | ICD-10-CM | POA: Diagnosis not present

## 2021-03-05 DIAGNOSIS — F900 Attention-deficit hyperactivity disorder, predominantly inattentive type: Secondary | ICD-10-CM | POA: Diagnosis not present

## 2021-03-05 DIAGNOSIS — M5134 Other intervertebral disc degeneration, thoracic region: Secondary | ICD-10-CM | POA: Diagnosis not present

## 2021-03-05 DIAGNOSIS — H7290 Unspecified perforation of tympanic membrane, unspecified ear: Secondary | ICD-10-CM | POA: Diagnosis not present

## 2021-03-05 DIAGNOSIS — M47812 Spondylosis without myelopathy or radiculopathy, cervical region: Secondary | ICD-10-CM | POA: Diagnosis not present

## 2021-03-05 DIAGNOSIS — Z0000 Encounter for general adult medical examination without abnormal findings: Secondary | ICD-10-CM | POA: Diagnosis not present

## 2021-03-05 DIAGNOSIS — F319 Bipolar disorder, unspecified: Secondary | ICD-10-CM | POA: Diagnosis not present

## 2021-03-05 DIAGNOSIS — Z1389 Encounter for screening for other disorder: Secondary | ICD-10-CM | POA: Diagnosis not present

## 2021-03-05 DIAGNOSIS — R82998 Other abnormal findings in urine: Secondary | ICD-10-CM | POA: Diagnosis not present

## 2021-03-05 MED ORDER — PREDNISONE 5 MG PO TABS
ORAL_TABLET | ORAL | 1 refills | Status: AC
  Filled 2021-03-05: qty 21, 6d supply, fill #0

## 2021-03-06 ENCOUNTER — Other Ambulatory Visit (HOSPITAL_COMMUNITY): Payer: Self-pay

## 2021-03-20 ENCOUNTER — Other Ambulatory Visit (HOSPITAL_COMMUNITY): Payer: Self-pay

## 2021-03-20 MED ORDER — FLUOXETINE HCL 40 MG PO CAPS
ORAL_CAPSULE | ORAL | 0 refills | Status: AC
  Filled 2021-03-20: qty 90, 90d supply, fill #0

## 2021-03-26 ENCOUNTER — Other Ambulatory Visit (HOSPITAL_COMMUNITY): Payer: Self-pay

## 2021-03-26 MED ORDER — IBUPROFEN 800 MG PO TABS
ORAL_TABLET | ORAL | 1 refills | Status: DC
  Filled 2021-03-26: qty 90, 30d supply, fill #0
  Filled 2021-07-06: qty 90, 30d supply, fill #1

## 2021-03-26 MED ORDER — CYCLOBENZAPRINE HCL 10 MG PO TABS
ORAL_TABLET | ORAL | 1 refills | Status: AC
  Filled 2021-03-26: qty 90, 30d supply, fill #0

## 2021-04-02 ENCOUNTER — Other Ambulatory Visit (HOSPITAL_COMMUNITY): Payer: Self-pay

## 2021-04-11 ENCOUNTER — Other Ambulatory Visit (HOSPITAL_COMMUNITY): Payer: Self-pay

## 2021-05-14 ENCOUNTER — Other Ambulatory Visit (HOSPITAL_COMMUNITY): Payer: Self-pay

## 2021-05-14 MED ORDER — OMEPRAZOLE 20 MG PO CPDR
DELAYED_RELEASE_CAPSULE | ORAL | 3 refills | Status: AC
  Filled 2021-05-14: qty 90, 90d supply, fill #0
  Filled 2021-08-08: qty 90, 90d supply, fill #1
  Filled 2021-10-30: qty 90, 90d supply, fill #2

## 2021-05-20 ENCOUNTER — Other Ambulatory Visit (HOSPITAL_COMMUNITY): Payer: Self-pay

## 2021-05-21 ENCOUNTER — Other Ambulatory Visit (HOSPITAL_COMMUNITY): Payer: Self-pay

## 2021-05-21 MED ORDER — AMPHETAMINE-DEXTROAMPHET ER 20 MG PO CP24
20.0000 mg | ORAL_CAPSULE | Freq: Every day | ORAL | 0 refills | Status: DC
  Filled 2021-06-23: qty 30, 30d supply, fill #0

## 2021-05-21 MED ORDER — AMPHETAMINE-DEXTROAMPHET ER 20 MG PO CP24
20.0000 mg | ORAL_CAPSULE | Freq: Every day | ORAL | 0 refills | Status: DC
  Filled 2021-07-29: qty 30, 30d supply, fill #0

## 2021-05-21 MED ORDER — AMPHETAMINE-DEXTROAMPHET ER 20 MG PO CP24
20.0000 mg | ORAL_CAPSULE | Freq: Every day | ORAL | 0 refills | Status: AC
  Filled 2021-05-21: qty 30, 30d supply, fill #0

## 2021-06-23 ENCOUNTER — Other Ambulatory Visit (HOSPITAL_COMMUNITY): Payer: Self-pay

## 2021-06-23 MED ORDER — FLUOXETINE HCL 40 MG PO CAPS
ORAL_CAPSULE | ORAL | 0 refills | Status: DC
  Filled 2021-06-23: qty 90, 90d supply, fill #0

## 2021-06-24 ENCOUNTER — Other Ambulatory Visit (HOSPITAL_COMMUNITY): Payer: Self-pay

## 2021-07-06 ENCOUNTER — Other Ambulatory Visit (HOSPITAL_COMMUNITY): Payer: Self-pay

## 2021-07-29 ENCOUNTER — Other Ambulatory Visit (HOSPITAL_COMMUNITY): Payer: Self-pay

## 2021-07-30 ENCOUNTER — Other Ambulatory Visit (HOSPITAL_COMMUNITY): Payer: Self-pay

## 2021-08-08 ENCOUNTER — Other Ambulatory Visit (HOSPITAL_COMMUNITY): Payer: Self-pay

## 2021-08-12 ENCOUNTER — Other Ambulatory Visit (HOSPITAL_COMMUNITY): Payer: Self-pay

## 2021-08-12 DIAGNOSIS — F3174 Bipolar disorder, in full remission, most recent episode manic: Secondary | ICD-10-CM | POA: Diagnosis not present

## 2021-08-12 DIAGNOSIS — F902 Attention-deficit hyperactivity disorder, combined type: Secondary | ICD-10-CM | POA: Diagnosis not present

## 2021-08-12 DIAGNOSIS — F1021 Alcohol dependence, in remission: Secondary | ICD-10-CM | POA: Diagnosis not present

## 2021-08-12 MED ORDER — VYVANSE 60 MG PO CAPS
ORAL_CAPSULE | Freq: Every day | ORAL | 0 refills | Status: AC
  Filled 2021-08-12: qty 30, 30d supply, fill #0

## 2021-08-12 MED ORDER — HYDROXYZINE PAMOATE 50 MG PO CAPS
50.0000 mg | ORAL_CAPSULE | Freq: Every day | ORAL | 1 refills | Status: AC | PRN
  Filled 2021-08-12: qty 90, 90d supply, fill #0

## 2021-08-12 MED ORDER — VYVANSE 60 MG PO CAPS
60.0000 mg | ORAL_CAPSULE | Freq: Every day | ORAL | 0 refills | Status: DC
  Filled 2021-10-30: qty 30, 30d supply, fill #0

## 2021-08-12 MED ORDER — FLUOXETINE HCL 40 MG PO CAPS
40.0000 mg | ORAL_CAPSULE | Freq: Every morning | ORAL | 1 refills | Status: AC
  Filled 2021-08-12 – 2021-09-23 (×2): qty 90, 90d supply, fill #0
  Filled 2021-12-15: qty 90, 90d supply, fill #1

## 2021-08-12 MED ORDER — VYVANSE 60 MG PO CAPS
60.0000 mg | ORAL_CAPSULE | Freq: Every day | ORAL | 0 refills | Status: AC
  Filled 2021-09-23: qty 30, 30d supply, fill #0

## 2021-08-12 MED ORDER — ZOLPIDEM TARTRATE 10 MG PO TABS: 10.0000 mg | ORAL_TABLET | Freq: Every evening | ORAL | 1 refills | Status: AC

## 2021-09-23 ENCOUNTER — Other Ambulatory Visit (HOSPITAL_COMMUNITY): Payer: Self-pay

## 2021-10-05 ENCOUNTER — Other Ambulatory Visit (HOSPITAL_COMMUNITY): Payer: Self-pay

## 2021-10-05 MED ORDER — AMOXICILLIN 500 MG PO CAPS
ORAL_CAPSULE | ORAL | 0 refills | Status: AC
Start: 2021-10-03 — End: ?
  Filled 2021-10-05: qty 21, 7d supply, fill #0

## 2021-10-08 ENCOUNTER — Other Ambulatory Visit (HOSPITAL_COMMUNITY): Payer: Self-pay

## 2021-10-30 ENCOUNTER — Other Ambulatory Visit (HOSPITAL_COMMUNITY): Payer: Self-pay

## 2021-11-12 ENCOUNTER — Other Ambulatory Visit (HOSPITAL_COMMUNITY): Payer: Self-pay

## 2021-11-12 MED ORDER — ESTRADIOL 0.1 MG/24HR TD PTTW
1.0000 | MEDICATED_PATCH | TRANSDERMAL | 0 refills | Status: AC
  Filled 2021-11-12: qty 24, 84d supply, fill #0

## 2021-11-12 MED ORDER — PROGESTERONE MICRONIZED 100 MG PO CAPS
100.0000 mg | ORAL_CAPSULE | Freq: Every evening | ORAL | 0 refills | Status: DC
  Filled 2021-11-12: qty 90, 90d supply, fill #0

## 2021-11-13 ENCOUNTER — Other Ambulatory Visit (HOSPITAL_COMMUNITY): Payer: Self-pay

## 2021-11-13 MED ORDER — IBUPROFEN 800 MG PO TABS
800.0000 mg | ORAL_TABLET | Freq: Three times a day (TID) | ORAL | 1 refills | Status: AC | PRN
  Filled 2021-11-13: qty 90, 30d supply, fill #0
  Filled 2022-04-22: qty 90, 30d supply, fill #1

## 2021-12-08 ENCOUNTER — Other Ambulatory Visit (HOSPITAL_COMMUNITY): Payer: Self-pay

## 2021-12-09 ENCOUNTER — Other Ambulatory Visit (HOSPITAL_COMMUNITY): Payer: Self-pay

## 2021-12-09 MED ORDER — LISDEXAMFETAMINE DIMESYLATE 60 MG PO CAPS
60.0000 mg | ORAL_CAPSULE | Freq: Every day | ORAL | 0 refills | Status: DC
  Filled 2021-12-09: qty 30, 30d supply, fill #0

## 2021-12-15 ENCOUNTER — Other Ambulatory Visit (HOSPITAL_COMMUNITY): Payer: Self-pay

## 2021-12-18 ENCOUNTER — Other Ambulatory Visit (HOSPITAL_COMMUNITY): Payer: Self-pay

## 2022-02-01 ENCOUNTER — Other Ambulatory Visit (HOSPITAL_COMMUNITY): Payer: Self-pay

## 2022-02-02 ENCOUNTER — Other Ambulatory Visit (HOSPITAL_COMMUNITY): Payer: Self-pay

## 2022-02-03 ENCOUNTER — Other Ambulatory Visit (HOSPITAL_COMMUNITY): Payer: Self-pay

## 2022-02-04 ENCOUNTER — Other Ambulatory Visit (HOSPITAL_COMMUNITY): Payer: Self-pay

## 2022-02-04 MED ORDER — LISDEXAMFETAMINE DIMESYLATE 60 MG PO CAPS
60.0000 mg | ORAL_CAPSULE | Freq: Every day | ORAL | 0 refills | Status: DC
  Filled 2022-02-04: qty 30, 30d supply, fill #0

## 2022-02-05 ENCOUNTER — Other Ambulatory Visit (HOSPITAL_COMMUNITY): Payer: Self-pay

## 2022-03-09 DIAGNOSIS — Z1212 Encounter for screening for malignant neoplasm of rectum: Secondary | ICD-10-CM | POA: Diagnosis not present

## 2022-03-09 DIAGNOSIS — E538 Deficiency of other specified B group vitamins: Secondary | ICD-10-CM | POA: Diagnosis not present

## 2022-03-10 ENCOUNTER — Other Ambulatory Visit (HOSPITAL_COMMUNITY): Payer: Self-pay

## 2022-03-10 DIAGNOSIS — E559 Vitamin D deficiency, unspecified: Secondary | ICD-10-CM | POA: Diagnosis not present

## 2022-03-10 DIAGNOSIS — R002 Palpitations: Secondary | ICD-10-CM | POA: Diagnosis not present

## 2022-03-10 DIAGNOSIS — E785 Hyperlipidemia, unspecified: Secondary | ICD-10-CM | POA: Diagnosis not present

## 2022-03-10 DIAGNOSIS — R7989 Other specified abnormal findings of blood chemistry: Secondary | ICD-10-CM | POA: Diagnosis not present

## 2022-03-10 DIAGNOSIS — Z0000 Encounter for general adult medical examination without abnormal findings: Secondary | ICD-10-CM | POA: Diagnosis not present

## 2022-03-11 ENCOUNTER — Other Ambulatory Visit: Payer: Self-pay

## 2022-03-11 ENCOUNTER — Other Ambulatory Visit (HOSPITAL_COMMUNITY): Payer: Self-pay

## 2022-03-11 MED ORDER — HYDROXYZINE PAMOATE 50 MG PO CAPS
50.0000 mg | ORAL_CAPSULE | Freq: Every day | ORAL | 0 refills | Status: AC | PRN
  Filled 2022-03-11 (×2): qty 90, 90d supply, fill #0

## 2022-03-11 MED ORDER — ZOLPIDEM TARTRATE 10 MG PO TABS
10.0000 mg | ORAL_TABLET | Freq: Every evening | ORAL | 0 refills | Status: DC
  Filled 2022-03-11 (×2): qty 90, 90d supply, fill #0

## 2022-03-11 MED ORDER — LISDEXAMFETAMINE DIMESYLATE 60 MG PO CAPS
60.0000 mg | ORAL_CAPSULE | Freq: Every day | ORAL | 0 refills | Status: DC
  Filled 2022-03-11 (×2): qty 30, 30d supply, fill #0

## 2022-03-11 MED ORDER — FLUOXETINE HCL 40 MG PO CAPS
40.0000 mg | ORAL_CAPSULE | ORAL | 0 refills | Status: DC
  Filled 2022-03-11 (×2): qty 90, 90d supply, fill #0

## 2022-03-15 ENCOUNTER — Other Ambulatory Visit (HOSPITAL_COMMUNITY): Payer: Self-pay

## 2022-03-16 ENCOUNTER — Other Ambulatory Visit (HOSPITAL_COMMUNITY): Payer: Self-pay

## 2022-03-16 ENCOUNTER — Other Ambulatory Visit: Payer: Self-pay

## 2022-03-16 ENCOUNTER — Other Ambulatory Visit: Payer: Self-pay | Admitting: Internal Medicine

## 2022-03-16 DIAGNOSIS — E785 Hyperlipidemia, unspecified: Secondary | ICD-10-CM

## 2022-03-16 DIAGNOSIS — Z0000 Encounter for general adult medical examination without abnormal findings: Secondary | ICD-10-CM | POA: Diagnosis not present

## 2022-03-16 DIAGNOSIS — F1010 Alcohol abuse, uncomplicated: Secondary | ICD-10-CM | POA: Diagnosis not present

## 2022-03-16 DIAGNOSIS — F321 Major depressive disorder, single episode, moderate: Secondary | ICD-10-CM | POA: Diagnosis not present

## 2022-03-16 DIAGNOSIS — F319 Bipolar disorder, unspecified: Secondary | ICD-10-CM | POA: Diagnosis not present

## 2022-03-16 DIAGNOSIS — H9212 Otorrhea, left ear: Secondary | ICD-10-CM | POA: Diagnosis not present

## 2022-03-16 DIAGNOSIS — F308 Other manic episodes: Secondary | ICD-10-CM | POA: Diagnosis not present

## 2022-03-16 DIAGNOSIS — Z1331 Encounter for screening for depression: Secondary | ICD-10-CM | POA: Diagnosis not present

## 2022-03-16 DIAGNOSIS — M5134 Other intervertebral disc degeneration, thoracic region: Secondary | ICD-10-CM | POA: Diagnosis not present

## 2022-03-16 DIAGNOSIS — F900 Attention-deficit hyperactivity disorder, predominantly inattentive type: Secondary | ICD-10-CM | POA: Diagnosis not present

## 2022-03-30 ENCOUNTER — Other Ambulatory Visit (HOSPITAL_COMMUNITY): Payer: Self-pay

## 2022-03-30 ENCOUNTER — Other Ambulatory Visit: Payer: Self-pay

## 2022-03-30 DIAGNOSIS — F3174 Bipolar disorder, in full remission, most recent episode manic: Secondary | ICD-10-CM | POA: Diagnosis not present

## 2022-03-30 DIAGNOSIS — F902 Attention-deficit hyperactivity disorder, combined type: Secondary | ICD-10-CM | POA: Diagnosis not present

## 2022-03-30 DIAGNOSIS — F1021 Alcohol dependence, in remission: Secondary | ICD-10-CM | POA: Diagnosis not present

## 2022-03-30 MED ORDER — LISDEXAMFETAMINE DIMESYLATE 60 MG PO CAPS
60.0000 mg | ORAL_CAPSULE | Freq: Every day | ORAL | 0 refills | Status: AC
  Filled 2022-05-26: qty 30, 30d supply, fill #0

## 2022-03-30 MED ORDER — ZOLPIDEM TARTRATE 10 MG PO TABS
10.0000 mg | ORAL_TABLET | Freq: Every evening | ORAL | 1 refills | Status: AC
  Filled 2022-03-30: qty 90, 90d supply, fill #0

## 2022-03-30 MED ORDER — HYDROXYZINE PAMOATE 50 MG PO CAPS
50.0000 mg | ORAL_CAPSULE | Freq: Every day | ORAL | 1 refills | Status: AC | PRN
  Filled 2022-03-30: qty 90, 90d supply, fill #0

## 2022-03-30 MED ORDER — LISDEXAMFETAMINE DIMESYLATE 60 MG PO CAPS
60.0000 mg | ORAL_CAPSULE | Freq: Every day | ORAL | 0 refills | Status: AC
  Filled 2022-03-30 – 2022-04-22 (×2): qty 30, 30d supply, fill #0

## 2022-03-30 MED ORDER — LISDEXAMFETAMINE DIMESYLATE 60 MG PO CAPS
60.0000 mg | ORAL_CAPSULE | Freq: Every day | ORAL | 0 refills | Status: DC
  Filled 2022-06-30: qty 30, 30d supply, fill #0

## 2022-03-30 MED ORDER — FLUOXETINE HCL 40 MG PO CAPS
40.0000 mg | ORAL_CAPSULE | Freq: Every morning | ORAL | 1 refills | Status: DC
  Filled 2022-03-30 – 2022-06-21 (×2): qty 90, 90d supply, fill #0
  Filled 2022-09-23: qty 90, 90d supply, fill #1

## 2022-04-04 ENCOUNTER — Other Ambulatory Visit (HOSPITAL_COMMUNITY): Payer: Self-pay

## 2022-04-06 ENCOUNTER — Other Ambulatory Visit (HOSPITAL_COMMUNITY): Payer: Self-pay

## 2022-04-06 ENCOUNTER — Other Ambulatory Visit: Payer: Self-pay

## 2022-04-06 MED ORDER — PROGESTERONE MICRONIZED 100 MG PO CAPS
100.0000 mg | ORAL_CAPSULE | Freq: Every day | ORAL | 3 refills | Status: AC
  Filled 2022-04-06: qty 90, 90d supply, fill #0

## 2022-04-14 DIAGNOSIS — Z01411 Encounter for gynecological examination (general) (routine) with abnormal findings: Secondary | ICD-10-CM | POA: Diagnosis not present

## 2022-04-14 DIAGNOSIS — Z113 Encounter for screening for infections with a predominantly sexual mode of transmission: Secondary | ICD-10-CM | POA: Diagnosis not present

## 2022-04-14 DIAGNOSIS — R87810 Cervical high risk human papillomavirus (HPV) DNA test positive: Secondary | ICD-10-CM | POA: Diagnosis not present

## 2022-04-14 DIAGNOSIS — Z1231 Encounter for screening mammogram for malignant neoplasm of breast: Secondary | ICD-10-CM | POA: Diagnosis not present

## 2022-04-14 DIAGNOSIS — Z124 Encounter for screening for malignant neoplasm of cervix: Secondary | ICD-10-CM | POA: Diagnosis not present

## 2022-04-14 DIAGNOSIS — Z01419 Encounter for gynecological examination (general) (routine) without abnormal findings: Secondary | ICD-10-CM | POA: Diagnosis not present

## 2022-04-15 ENCOUNTER — Other Ambulatory Visit (HOSPITAL_COMMUNITY): Payer: Self-pay

## 2022-04-15 ENCOUNTER — Other Ambulatory Visit: Payer: Self-pay

## 2022-04-15 MED ORDER — PROGESTERONE MICRONIZED 100 MG PO CAPS
100.0000 mg | ORAL_CAPSULE | Freq: Every day | ORAL | 3 refills | Status: AC
  Filled 2022-04-15 – 2023-02-12 (×2): qty 90, 90d supply, fill #0

## 2022-04-15 MED ORDER — ESTRADIOL 0.05 MG/24HR TD PTTW
1.0000 | MEDICATED_PATCH | TRANSDERMAL | 3 refills | Status: AC
  Filled 2022-04-15: qty 24, 84d supply, fill #0
  Filled 2022-06-30 – 2022-07-22 (×2): qty 24, 84d supply, fill #1
  Filled 2023-02-12: qty 24, 84d supply, fill #2

## 2022-04-19 ENCOUNTER — Other Ambulatory Visit: Payer: Commercial Managed Care - PPO

## 2022-04-20 ENCOUNTER — Other Ambulatory Visit: Payer: Self-pay

## 2022-04-20 ENCOUNTER — Other Ambulatory Visit (HOSPITAL_COMMUNITY): Payer: Self-pay

## 2022-04-20 MED ORDER — METRONIDAZOLE 500 MG PO TABS
500.0000 mg | ORAL_TABLET | Freq: Two times a day (BID) | ORAL | 0 refills | Status: DC
  Filled 2022-04-20: qty 14, 7d supply, fill #0

## 2022-04-22 ENCOUNTER — Other Ambulatory Visit: Payer: Self-pay

## 2022-04-22 ENCOUNTER — Other Ambulatory Visit (HOSPITAL_COMMUNITY): Payer: Self-pay

## 2022-04-26 ENCOUNTER — Other Ambulatory Visit (HOSPITAL_COMMUNITY): Payer: Self-pay

## 2022-04-27 ENCOUNTER — Other Ambulatory Visit: Payer: Self-pay

## 2022-05-26 ENCOUNTER — Other Ambulatory Visit (HOSPITAL_COMMUNITY): Payer: Self-pay

## 2022-05-27 ENCOUNTER — Other Ambulatory Visit: Payer: Self-pay

## 2022-05-31 ENCOUNTER — Ambulatory Visit
Admission: RE | Admit: 2022-05-31 | Discharge: 2022-05-31 | Disposition: A | Discharge status: Home / Self Care | Payer: Commercial Managed Care - PPO | Source: Ambulatory Visit | Attending: Internal Medicine | Admitting: Internal Medicine

## 2022-05-31 DIAGNOSIS — J439 Emphysema, unspecified: Secondary | ICD-10-CM | POA: Diagnosis not present

## 2022-05-31 DIAGNOSIS — E785 Hyperlipidemia, unspecified: Secondary | ICD-10-CM

## 2022-05-31 DIAGNOSIS — Z136 Encounter for screening for cardiovascular disorders: Secondary | ICD-10-CM | POA: Diagnosis not present

## 2022-06-16 ENCOUNTER — Encounter: Payer: Self-pay | Admitting: Emergency Medicine

## 2022-06-16 ENCOUNTER — Ambulatory Visit: Payer: Commercial Managed Care - PPO | Admitting: Emergency Medicine

## 2022-06-16 VITALS — BP 126/74 | HR 64 | Temp 98.1°F | Ht 63.5 in | Wt 123.4 lb

## 2022-06-16 DIAGNOSIS — R9389 Abnormal findings on diagnostic imaging of other specified body structures: Secondary | ICD-10-CM | POA: Diagnosis not present

## 2022-06-16 DIAGNOSIS — Z720 Tobacco use: Secondary | ICD-10-CM

## 2022-06-16 NOTE — Assessment & Plan Note (Signed)
Discussed cessation with her today.  She is in the contemplative phase.  She does understand the benefits.  We will revisit this going forward.  She will likely need pulmonary function testing going forward as well to quantify any degree of obstruction, evolving COPD.

## 2022-06-16 NOTE — Patient Instructions (Signed)
We reviewed your CT scan of the chest today. We will plan to repeat your CT in August 2024 to compare Work hard to decrease your cigarettes.  This is one of the most important things you can do to enhance her health.  We will try to help you with this going forward. We will likely arrange for pulmonary function testing at a future visit. Follow Dr. Delton Coombes in August after your CT so we can review those results together.

## 2022-06-16 NOTE — Progress Notes (Signed)
Subjective:    Patient ID: Marie Montgomery, female    DOB: 1975/12/02, 47 y.o.   MRN: [id number]  HPI 47 year old woman with a history of tobacco use (30 pack years, 15 cigarettes daily. Vapes CBD), cervical spondylosis, hyperlipidemia, bipolar depression.  She is referred today for an abnormal CT scan of the chest.  She underwent a cardiac calcium scoring CT chest 05/31/2022 as below. She is well, is active. She has daily cough. Sometimes productive. No CP. No wheeze.   CT calcium scoring chest 05/31/2022 reviewed by me shows calcium score of 2.4.  No mediastinal or hilar lymphadenopathy.  Clustered area of groundglass nodular infiltrate in the right middle lobe, largest nodule 9 mm.  There is a small subpleural solid pulmonary nodule in the right lower lobe 4 mm.   Review of Systems As per HPI  PMH: Hyperlipidemia Bipolar depression Cervical spondylosis  FHx: No family hx lung CA,   Social History   Socioeconomic History   Marital status: Married    Spouse name: Not on file   Number of children: Not on file   Years of education: Not on file   Highest education level: Not on file  Occupational History   Not on file  Tobacco Use   Smoking status: Every Day    Packs/day: 1.00    Years: 30.00    Additional pack years: 0.00    Total pack years: 30.00    Types: Cigarettes   Smokeless tobacco: Never   Tobacco comments:    15 cigarettes smoked daily [facility] 06/16/22 pt also uses CBD   Substance and Sexual Activity   Alcohol use: Not on file   Drug use: Not on file   Sexual activity: Not on file  Other Topics Concern   Not on file  Social History Narrative   Not on file   Social Determinants of Health   Financial Resource Strain: Not on file  Food Insecurity: Not on file  Transportation Needs: Not on file  Physical Activity: Not on file  Stress: Not on file  Social Connections: Not on file  Intimate Partner Violence: Not on file    She is an ED nurse Her TB  testing is negative From [place] May have had a mold exposure in attic in former home She lives w her sister who owns a bird. Father owns a chicken but minimal exposure No hot tub  Allergies  Allergen Reactions   Sulfa Antibiotics Hives     Outpatient Medications Prior to Visit  Medication Sig Dispense Refill   estradiol (VIVELLE-DOT) 0.1 MG/24HR patch Place 1 patch (0.1 mg total) onto the skin 2 (two) times a week. 24 patch 0   FLUoxetine (PROZAC) 40 MG capsule Take 1 capsule (40 mg total) by mouth in the morning. 90 capsule 1   lisdexamfetamine (VYVANSE) 60 MG capsule Take 1 capsule (60 mg total) by mouth daily. 30 capsule 0   progesterone (PROMETRIUM) 100 MG capsule Take 1 capsule by mouth at bedtime. 90 capsule 3   amoxicillin (AMOXIL) 500 MG capsule Take 1 capsule by mouth  3 times daily for 7 days. 21 capsule 0   amphetamine-dextroamphetamine (ADDERALL XR) 20 MG 24 hr capsule Take 1 capsule by mouth daily 30 capsule 0   amphetamine-dextroamphetamine (ADDERALL XR) 20 MG 24 hr capsule Take 1 capsule by mouth daily. 30 capsule 0   Conj Estrogens-Bazedoxifene (DUAVEE) 0.45-20 MG TABS Take 1 tablet by mouth once daily 90 tablet 1   Conj  Estrogens-Bazedoxifene (DUAVEE) 0.45-20 MG TABS Take 1 tablet by mouth daily 90 tablet 1   cyclobenzaprine (FLEXERIL) 10 MG tablet Take 1 tablet by mouth 3 times a day as needed 90 tablet 1   DULoxetine (CYMBALTA) 30 MG capsule TAKE 2 CAPSULES BY MOUTH EVERY MORNING AND 1 CAPSULE IN THE EVENING 90 capsule 2   DULoxetine (CYMBALTA) 30 MG capsule TAKE 2 CAPSULES EACH MORNING AND 1 CAPSULE EACH EVENING. 90 capsule 2   DULoxetine (CYMBALTA) 30 MG capsule TAKE 1 CAPSULE BY MOUTH EVERY MORNING FOR 2 WEEKS THEN INCREASE TO 2 CAPSULES BY MOUTH EVERY MORNING. STOP WELLBUTRIN. 60 capsule 0   DULoxetine (CYMBALTA) 30 MG capsule Take 2 capsules by mouth every morning and take 1 capsule every evening. 90 capsule 2   estradiol (VIVELLE-DOT) 0.05 MG/24HR patch Place 1 patch  (0.05 mg total) onto the skin 2 (two) times a week. 24 patch 3   estradiol (VIVELLE-DOT) 0.1 MG/24HR patch Apply 1 patch twice a week 24 patch 3   FLUoxetine (PROZAC) 20 MG capsule Take 1 capsule by mouth every morning 90 capsule 1   FLUoxetine (PROZAC) 40 MG capsule Take 1 capsule by mouth every morning 90 capsule 0   FLUoxetine (PROZAC) 40 MG capsule Take 1 capsule (40 mg total) by mouth every morning. 90 capsule 1   hydrOXYzine (VISTARIL) 50 MG capsule Take one capsule by mouth twice daily as needed for anxiety. 180 capsule 0   hydrOXYzine (VISTARIL) 50 MG capsule Take 1 capsule (50 mg total) by mouth daily as needed for anxiety. 90 capsule 1   hydrOXYzine (VISTARIL) 50 MG capsule Take 1 capsule (50 mg total) by mouth daily as needed for anxiety. 90 capsule 0   hydrOXYzine (VISTARIL) 50 MG capsule Take 1 capsule (50 mg total) by mouth daily as needed for anxiety. 90 capsule 1   ibuprofen (ADVIL) 800 MG tablet Take 1 tablet (800 mg total) by mouth 3 (three) times daily with food as needed for pain. 90 tablet 1   lamoTRIgine (LAMICTAL) 150 MG tablet Take one tablet by mouth at bedtime. 90 tablet 0   lamoTRIgine (LAMICTAL) 150 MG tablet Take one tablet by mouth at bedtime. 90 tablet 1   lamoTRIgine (LAMICTAL) 25 MG tablet Take 1 tablet by mouth nightly at bedtime for 2 weeks, then take 2 tablets nightly at bedtime for 2 weeks, then increase to 4 tablets nightly at bedtime 120 tablet 0   lamoTRIgine (LAMICTAL) 25 MG tablet Take 1 tablet by mouth at bedtime for 2 weeks then take 2 tablets at bedtime for 2 weeks then take 4 tablets at bedtime 120 tablet 0   lisdexamfetamine (VYVANSE) 60 MG capsule TAKE 1 CAPSULE BY MOUTH EVERY MORNING (04/01/20) 30 capsule 0   lisdexamfetamine (VYVANSE) 60 MG capsule TAKE 1 CAPSULE BY MOUTH EVERY MORNING 30 capsule 0   lisdexamfetamine (VYVANSE) 60 MG capsule TAKE 1 CAPSULE BY MOUTH EVERY MORNING 30 capsule 0   lisdexamfetamine (VYVANSE) 60 MG capsule TAKE 1 CAPSULE BY  MOUTH DAILY EVERY MORNING. 30 capsule 0   lisdexamfetamine (VYVANSE) 60 MG capsule TAKE 1 CAPSULE BY MOUTH ONCE DAILY EVERY MORNING. 30 capsule 0   lisdexamfetamine (VYVANSE) 60 MG capsule Take 1 tablet by mouth every morning 30 capsule 0   lisdexamfetamine (VYVANSE) 60 MG capsule Take 1 capsule (60 mg total) by mouth every morning. 30 capsule 0   lisdexamfetamine (VYVANSE) 60 MG capsule Take 1 capsule by mouth every morning 30 capsule 0   lisdexamfetamine (  VYVANSE) 60 MG capsule Take 1 capsule by mouth every morning. 30 capsule 0   lisdexamfetamine (VYVANSE) 60 MG capsule Take 1 capsule by mouth every morning. 30 capsule 0   lisdexamfetamine (VYVANSE) 60 MG capsule Take 1 capsule by mouth daily 30 capsule 0   lisdexamfetamine (VYVANSE) 60 MG capsule Take 1 capsule (60 mg total) by mouth daily. (fill 09/12/21) 30 capsule 0   lisdexamfetamine (VYVANSE) 60 MG capsule Take 1 capsule (60 mg total) by mouth daily. 30 capsule 0   lisdexamfetamine (VYVANSE) 60 MG capsule Take 1 capsule (60 mg total) by mouth daily. 30 capsule 0   metroNIDAZOLE (FLAGYL) 500 MG tablet Take 1 tablet (500 mg total) by mouth 2 (two) times daily as directed. 14 tablet 0   omeprazole (PRILOSEC) 20 MG capsule TAKE 1 CAPSULE BY MOUTH ONCE A DAY FOR GERD 90 capsule 3   omeprazole (PRILOSEC) 20 MG capsule TAKE 1 CAPSULE BY MOUTH ONCE A DAY FOR GERD 90 capsule 3   predniSONE (DELTASONE) 5 MG tablet Take 6 tablets by mouth on day 1, decrease by 1 tablet daily until gone (6-5-4-3-2-1) 21 tablet 1   progesterone (PROMETRIUM) 100 MG capsule Take 1 capsule by mouth at bedtime. 90 capsule 3   progesterone (PROMETRIUM) 100 MG capsule Take 1 capsule (100 mg total) by mouth at bedtime. 90 capsule 3   progesterone (PROMETRIUM) 100 MG capsule Take 1 capsule (100 mg total) by mouth at bedtime. 90 capsule 3   zolpidem (AMBIEN) 10 MG tablet TAKE 1 TABLET AT BEDTIME 30 tablet 2   zolpidem (AMBIEN) 10 MG tablet Take 1 tablet by mouth at bedtime. 90  tablet 1   zolpidem (AMBIEN) 10 MG tablet Take 1 tablet (10 mg total) by mouth at bedtime. 90 tablet 1   zolpidem (AMBIEN) 10 MG tablet Take 1 tablet (10 mg total) by mouth at bedtime. 90 tablet 1   No facility-administered medications prior to visit.         Objective:   Physical Exam  Vitals:   06/16/22 0928  BP: 126/74  Pulse: 64  Temp: 98.1 F (36.7 C)  TempSrc: Oral  SpO2: 98%  Weight: 123 lb 6.4 oz (56 kg)  Height: 5' 3.5" (1.613 m)   Gen: Pleasant, well-nourished, in no distress,  normal affect  ENT: No lesions,  mouth clear,  oropharynx clear, no postnasal drip  Neck: No JVD, no stridor  Lungs: No use of accessory muscles, no crackles or wheezing on normal respiration, no wheeze on forced expiration  Cardiovascular: RRR, heart sounds normal, no murmur or gallops, no peripheral edema  Musculoskeletal: No deformities, no cyanosis or clubbing  Neuro: alert, awake, non focal  Skin: Warm, no lesions or rash     Assessment & Plan:   Abnormal CT of the chest Micronodular inflammatory focus in the right middle lobe, associated right middle lobe nodule 9 mm, another more medial right lower lobe 4 mm nodule.  Unclear significance.  Reviewed the differential diagnosis with her which includes infectious or noninfectious inflammatory foci, possible tobacco related or vape related pneumonitis.  Low likelihood but also possible malignancy.  We we will plan to repeat her CT chest in 3 months, decide what workup is appropriate, either follow-up imaging, bronchoscopy, other.  Tobacco use Discussed cessation with her today.  She is in the contemplative phase.  She does understand the benefits.  We will revisit this going forward.  She will likely need pulmonary function testing going forward as well  to quantify any degree of obstruction, evolving COPD.   Levy Pupa, MD, PhD 06/16/2022, 10:09 AM [facility] [phone] or if no answer before 7:00PM  call [phone] For any issues after 7:00PM please call eLink [phone]

## 2022-06-16 NOTE — Assessment & Plan Note (Signed)
Micronodular inflammatory focus in the right middle lobe, associated right middle lobe nodule 9 mm, another more medial right lower lobe 4 mm nodule.  Unclear significance.  Reviewed the differential diagnosis with her which includes infectious or noninfectious inflammatory foci, possible tobacco related or vape related pneumonitis.  Low likelihood but also possible malignancy.  We we will plan to repeat her CT chest in 3 months, decide what workup is appropriate, either follow-up imaging, bronchoscopy, other.

## 2022-06-21 ENCOUNTER — Other Ambulatory Visit: Payer: Self-pay

## 2022-06-22 ENCOUNTER — Other Ambulatory Visit: Payer: Self-pay

## 2022-06-22 ENCOUNTER — Other Ambulatory Visit (HOSPITAL_COMMUNITY): Payer: Self-pay

## 2022-06-22 DIAGNOSIS — H7292 Unspecified perforation of tympanic membrane, left ear: Secondary | ICD-10-CM | POA: Diagnosis not present

## 2022-06-22 DIAGNOSIS — H9012 Conductive hearing loss, unilateral, left ear, with unrestricted hearing on the contralateral side: Secondary | ICD-10-CM | POA: Diagnosis not present

## 2022-06-23 ENCOUNTER — Other Ambulatory Visit (HOSPITAL_COMMUNITY): Payer: Self-pay

## 2022-06-28 ENCOUNTER — Other Ambulatory Visit (HOSPITAL_COMMUNITY): Payer: Self-pay

## 2022-06-30 ENCOUNTER — Other Ambulatory Visit (HOSPITAL_COMMUNITY): Payer: Self-pay

## 2022-07-01 ENCOUNTER — Other Ambulatory Visit (HOSPITAL_COMMUNITY): Payer: Self-pay

## 2022-07-01 ENCOUNTER — Other Ambulatory Visit: Payer: Self-pay

## 2022-07-02 ENCOUNTER — Other Ambulatory Visit: Payer: Self-pay

## 2022-07-05 ENCOUNTER — Other Ambulatory Visit (HOSPITAL_COMMUNITY): Payer: Self-pay

## 2022-07-07 ENCOUNTER — Other Ambulatory Visit (HOSPITAL_COMMUNITY): Payer: Self-pay

## 2022-07-08 ENCOUNTER — Other Ambulatory Visit: Payer: Self-pay

## 2022-07-22 ENCOUNTER — Other Ambulatory Visit (HOSPITAL_COMMUNITY): Payer: Self-pay

## 2022-07-26 DIAGNOSIS — H9012 Conductive hearing loss, unilateral, left ear, with unrestricted hearing on the contralateral side: Secondary | ICD-10-CM | POA: Diagnosis not present

## 2022-08-09 ENCOUNTER — Other Ambulatory Visit (HOSPITAL_COMMUNITY): Payer: Self-pay

## 2022-08-13 ENCOUNTER — Other Ambulatory Visit: Payer: Self-pay

## 2022-08-13 ENCOUNTER — Other Ambulatory Visit (HOSPITAL_COMMUNITY): Payer: Self-pay

## 2022-08-13 MED ORDER — LISDEXAMFETAMINE DIMESYLATE 60 MG PO CAPS
60.0000 mg | ORAL_CAPSULE | Freq: Every day | ORAL | 0 refills | Status: AC
  Filled 2022-08-13: qty 30, 30d supply, fill #0

## 2022-08-16 ENCOUNTER — Other Ambulatory Visit (HOSPITAL_COMMUNITY): Payer: Self-pay

## 2022-08-17 ENCOUNTER — Other Ambulatory Visit (HOSPITAL_COMMUNITY): Payer: Self-pay

## 2022-09-02 ENCOUNTER — Other Ambulatory Visit (HOSPITAL_COMMUNITY): Payer: Self-pay

## 2022-09-02 DIAGNOSIS — Z113 Encounter for screening for infections with a predominantly sexual mode of transmission: Secondary | ICD-10-CM | POA: Diagnosis not present

## 2022-09-02 DIAGNOSIS — Z1159 Encounter for screening for other viral diseases: Secondary | ICD-10-CM | POA: Diagnosis not present

## 2022-09-02 DIAGNOSIS — Z114 Encounter for screening for human immunodeficiency virus [HIV]: Secondary | ICD-10-CM | POA: Diagnosis not present

## 2022-09-02 MED ORDER — ESTRADIOL 0.075 MG/24HR TD PTTW
MEDICATED_PATCH | TRANSDERMAL | 3 refills | Status: DC
  Filled 2022-09-02: qty 24, 84d supply, fill #0

## 2022-09-02 MED ORDER — METRONIDAZOLE 500 MG PO TABS
500.0000 mg | ORAL_TABLET | Freq: Two times a day (BID) | ORAL | 0 refills | Status: AC
  Filled 2022-09-02: qty 14, 7d supply, fill #0

## 2022-09-07 ENCOUNTER — Ambulatory Visit
Admission: RE | Admit: 2022-09-07 | Discharge: 2022-09-07 | Disposition: A | Discharge status: Home / Self Care | Payer: Commercial Managed Care - PPO | Source: Ambulatory Visit | Attending: Emergency Medicine | Admitting: Emergency Medicine

## 2022-09-07 DIAGNOSIS — R918 Other nonspecific abnormal finding of lung field: Secondary | ICD-10-CM | POA: Diagnosis not present

## 2022-09-07 DIAGNOSIS — J439 Emphysema, unspecified: Secondary | ICD-10-CM | POA: Diagnosis not present

## 2022-09-07 DIAGNOSIS — R9389 Abnormal findings on diagnostic imaging of other specified body structures: Secondary | ICD-10-CM

## 2022-09-09 ENCOUNTER — Encounter (HOSPITAL_COMMUNITY): Payer: Self-pay

## 2022-09-09 ENCOUNTER — Telehealth: Payer: Self-pay | Admitting: Emergency Medicine

## 2022-09-09 ENCOUNTER — Other Ambulatory Visit (HOSPITAL_COMMUNITY): Payer: Self-pay

## 2022-09-09 ENCOUNTER — Other Ambulatory Visit: Payer: Self-pay

## 2022-09-09 MED ORDER — VALACYCLOVIR HCL 1 G PO TABS
2000.0000 mg | ORAL_TABLET | Freq: Two times a day (BID) | ORAL | 4 refills | Status: AC | PRN
  Filled 2022-09-09: qty 20, 5d supply, fill #0
  Filled 2023-01-21: qty 20, 5d supply, fill #1

## 2022-09-09 MED ORDER — AMPHETAMINE-DEXTROAMPHET ER 20 MG PO CP24
20.0000 mg | ORAL_CAPSULE | Freq: Every morning | ORAL | 0 refills | Status: AC
  Filled 2022-09-09: qty 30, 30d supply, fill #0

## 2022-09-09 MED ORDER — PROGESTERONE MICRONIZED 100 MG PO CAPS
100.0000 mg | ORAL_CAPSULE | Freq: Every day | ORAL | 3 refills | Status: AC
  Filled 2022-09-09: qty 90, 90d supply, fill #0

## 2022-09-09 NOTE — Telephone Encounter (Signed)
Disc dropped off on 8/15. Placed in blue container by the phones. Patient has appointment on 9/11

## 2022-09-10 ENCOUNTER — Encounter: Payer: Self-pay | Admitting: Pharmacist

## 2022-09-10 ENCOUNTER — Other Ambulatory Visit: Payer: Self-pay

## 2022-09-15 ENCOUNTER — Other Ambulatory Visit: Payer: Self-pay

## 2022-09-23 ENCOUNTER — Other Ambulatory Visit (HOSPITAL_COMMUNITY): Payer: Self-pay

## 2022-09-30 ENCOUNTER — Other Ambulatory Visit (HOSPITAL_COMMUNITY): Payer: Self-pay

## 2022-09-30 DIAGNOSIS — F3174 Bipolar disorder, in full remission, most recent episode manic: Secondary | ICD-10-CM | POA: Diagnosis not present

## 2022-09-30 DIAGNOSIS — F4323 Adjustment disorder with mixed anxiety and depressed mood: Secondary | ICD-10-CM | POA: Diagnosis not present

## 2022-09-30 DIAGNOSIS — F902 Attention-deficit hyperactivity disorder, combined type: Secondary | ICD-10-CM | POA: Diagnosis not present

## 2022-09-30 DIAGNOSIS — Z5181 Encounter for therapeutic drug level monitoring: Secondary | ICD-10-CM | POA: Diagnosis not present

## 2022-09-30 DIAGNOSIS — F1021 Alcohol dependence, in remission: Secondary | ICD-10-CM | POA: Diagnosis not present

## 2022-09-30 MED ORDER — AMPHETAMINE-DEXTROAMPHETAMINE 20 MG PO TABS
20.0000 mg | ORAL_TABLET | Freq: Every morning | ORAL | 0 refills | Status: AC
  Filled 2022-09-30: qty 30, 30d supply, fill #0

## 2022-09-30 MED ORDER — FLUOXETINE HCL 40 MG PO CAPS
40.0000 mg | ORAL_CAPSULE | Freq: Every morning | ORAL | 0 refills | Status: AC
  Filled 2022-12-28: qty 90, 90d supply, fill #0

## 2022-09-30 MED ORDER — AMPHETAMINE-DEXTROAMPHETAMINE 20 MG PO TABS
20.0000 mg | ORAL_TABLET | Freq: Every morning | ORAL | 0 refills | Status: DC
Start: 2022-10-30 — End: 2022-12-29
  Filled 2022-11-24: qty 30, 30d supply, fill #0

## 2022-10-01 ENCOUNTER — Other Ambulatory Visit (HOSPITAL_COMMUNITY): Payer: Self-pay

## 2022-10-01 ENCOUNTER — Other Ambulatory Visit: Payer: Self-pay

## 2022-10-06 ENCOUNTER — Ambulatory Visit: Payer: Commercial Managed Care - PPO | Admitting: Emergency Medicine

## 2022-10-07 DIAGNOSIS — F4323 Adjustment disorder with mixed anxiety and depressed mood: Secondary | ICD-10-CM | POA: Diagnosis not present

## 2022-10-07 DIAGNOSIS — F902 Attention-deficit hyperactivity disorder, combined type: Secondary | ICD-10-CM | POA: Diagnosis not present

## 2022-10-11 ENCOUNTER — Encounter: Payer: Self-pay | Admitting: Emergency Medicine

## 2022-10-11 DIAGNOSIS — F902 Attention-deficit hyperactivity disorder, combined type: Secondary | ICD-10-CM | POA: Diagnosis not present

## 2022-10-11 DIAGNOSIS — F4323 Adjustment disorder with mixed anxiety and depressed mood: Secondary | ICD-10-CM | POA: Diagnosis not present

## 2022-10-20 ENCOUNTER — Other Ambulatory Visit (HOSPITAL_COMMUNITY): Payer: Self-pay

## 2022-10-20 MED ORDER — ESTRADIOL 0.05 MG/24HR TD PTTW
1.0000 | MEDICATED_PATCH | TRANSDERMAL | 1 refills | Status: DC
  Filled 2022-10-20: qty 24, 84d supply, fill #0
  Filled 2023-05-11 (×2): qty 24, 84d supply, fill #1

## 2022-10-21 ENCOUNTER — Other Ambulatory Visit: Payer: Self-pay

## 2022-11-08 DIAGNOSIS — F902 Attention-deficit hyperactivity disorder, combined type: Secondary | ICD-10-CM | POA: Diagnosis not present

## 2022-11-08 DIAGNOSIS — F4323 Adjustment disorder with mixed anxiety and depressed mood: Secondary | ICD-10-CM | POA: Diagnosis not present

## 2022-11-24 ENCOUNTER — Other Ambulatory Visit: Payer: Self-pay

## 2022-11-24 ENCOUNTER — Other Ambulatory Visit (HOSPITAL_COMMUNITY): Payer: Self-pay

## 2022-11-25 ENCOUNTER — Other Ambulatory Visit: Payer: Self-pay

## 2022-12-28 ENCOUNTER — Other Ambulatory Visit (HOSPITAL_COMMUNITY): Payer: Self-pay

## 2022-12-28 ENCOUNTER — Other Ambulatory Visit: Payer: Self-pay

## 2022-12-28 MED ORDER — FLUOXETINE HCL 40 MG PO CAPS
40.0000 mg | ORAL_CAPSULE | Freq: Every morning | ORAL | 0 refills | Status: AC
  Filled 2022-12-28 – 2023-03-21 (×2): qty 90, 90d supply, fill #0

## 2022-12-28 MED ORDER — HYDROXYZINE PAMOATE 50 MG PO CAPS
50.0000 mg | ORAL_CAPSULE | Freq: Every day | ORAL | 0 refills | Status: AC | PRN
  Filled 2022-12-28: qty 90, 90d supply, fill #0

## 2022-12-29 ENCOUNTER — Other Ambulatory Visit: Payer: Self-pay

## 2022-12-29 ENCOUNTER — Other Ambulatory Visit (HOSPITAL_COMMUNITY): Payer: Self-pay

## 2022-12-29 MED ORDER — AMPHETAMINE-DEXTROAMPHETAMINE 20 MG PO TABS
20.0000 mg | ORAL_TABLET | Freq: Every morning | ORAL | 0 refills | Status: DC
  Filled 2022-12-29: qty 30, 30d supply, fill #0

## 2023-01-12 ENCOUNTER — Other Ambulatory Visit (HOSPITAL_COMMUNITY): Payer: Self-pay

## 2023-01-12 DIAGNOSIS — F902 Attention-deficit hyperactivity disorder, combined type: Secondary | ICD-10-CM | POA: Diagnosis not present

## 2023-01-12 DIAGNOSIS — F3174 Bipolar disorder, in full remission, most recent episode manic: Secondary | ICD-10-CM | POA: Diagnosis not present

## 2023-01-12 MED ORDER — FLUOXETINE HCL 40 MG PO CAPS: 40.0000 mg | ORAL_CAPSULE | Freq: Every morning | ORAL | 1 refills | Status: AC

## 2023-01-12 MED ORDER — AMPHETAMINE-DEXTROAMPHETAMINE 20 MG PO TABS
20.0000 mg | ORAL_TABLET | Freq: Every day | ORAL | 0 refills | Status: AC
  Filled 2023-02-12: qty 30, 30d supply, fill #0

## 2023-01-12 MED ORDER — AMPHETAMINE-DEXTROAMPHETAMINE 20 MG PO TABS
20.0000 mg | ORAL_TABLET | Freq: Every day | ORAL | 0 refills | Status: DC
  Filled 2023-03-21: qty 30, 30d supply, fill #0

## 2023-01-21 ENCOUNTER — Other Ambulatory Visit: Payer: Self-pay

## 2023-02-09 ENCOUNTER — Other Ambulatory Visit (HOSPITAL_COMMUNITY): Payer: Self-pay

## 2023-02-12 ENCOUNTER — Other Ambulatory Visit (HOSPITAL_COMMUNITY): Payer: Self-pay

## 2023-02-14 ENCOUNTER — Other Ambulatory Visit: Payer: Self-pay

## 2023-03-21 ENCOUNTER — Other Ambulatory Visit (HOSPITAL_COMMUNITY): Payer: Self-pay

## 2023-03-22 ENCOUNTER — Other Ambulatory Visit: Payer: Self-pay

## 2023-05-02 DIAGNOSIS — Z1212 Encounter for screening for malignant neoplasm of rectum: Secondary | ICD-10-CM | POA: Diagnosis not present

## 2023-05-02 DIAGNOSIS — E538 Deficiency of other specified B group vitamins: Secondary | ICD-10-CM | POA: Diagnosis not present

## 2023-05-04 ENCOUNTER — Other Ambulatory Visit: Payer: Self-pay

## 2023-05-04 ENCOUNTER — Other Ambulatory Visit (HOSPITAL_COMMUNITY): Payer: Self-pay

## 2023-05-04 MED ORDER — AMPHETAMINE-DEXTROAMPHETAMINE 20 MG PO TABS
20.0000 mg | ORAL_TABLET | Freq: Every day | ORAL | 0 refills | Status: DC
  Filled 2023-05-04: qty 30, 30d supply, fill #0

## 2023-05-04 MED ORDER — HYDROXYZINE PAMOATE 50 MG PO CAPS
50.0000 mg | ORAL_CAPSULE | Freq: Every day | ORAL | 0 refills | Status: AC | PRN
  Filled 2023-05-04: qty 90, 90d supply, fill #0

## 2023-05-08 ENCOUNTER — Other Ambulatory Visit (HOSPITAL_COMMUNITY): Payer: Self-pay

## 2023-05-11 ENCOUNTER — Other Ambulatory Visit (HOSPITAL_COMMUNITY): Payer: Self-pay

## 2023-05-12 ENCOUNTER — Other Ambulatory Visit (HOSPITAL_COMMUNITY): Payer: Self-pay

## 2023-05-12 ENCOUNTER — Other Ambulatory Visit: Payer: Self-pay

## 2023-05-25 DIAGNOSIS — E559 Vitamin D deficiency, unspecified: Secondary | ICD-10-CM | POA: Diagnosis not present

## 2023-05-25 DIAGNOSIS — E785 Hyperlipidemia, unspecified: Secondary | ICD-10-CM | POA: Diagnosis not present

## 2023-05-31 ENCOUNTER — Other Ambulatory Visit (HOSPITAL_COMMUNITY): Payer: Self-pay

## 2023-05-31 DIAGNOSIS — R82998 Other abnormal findings in urine: Secondary | ICD-10-CM | POA: Diagnosis not present

## 2023-05-31 MED ORDER — VALACYCLOVIR HCL 1 G PO TABS
2000.0000 mg | ORAL_TABLET | Freq: Two times a day (BID) | ORAL | 4 refills | Status: AC
  Filled 2023-05-31: qty 20, 5d supply, fill #0

## 2023-06-03 ENCOUNTER — Other Ambulatory Visit: Payer: Self-pay

## 2023-06-06 ENCOUNTER — Other Ambulatory Visit (HOSPITAL_COMMUNITY): Payer: Self-pay

## 2023-06-21 DIAGNOSIS — R87810 Cervical high risk human papillomavirus (HPV) DNA test positive: Secondary | ICD-10-CM | POA: Diagnosis not present

## 2023-06-21 DIAGNOSIS — Z01419 Encounter for gynecological examination (general) (routine) without abnormal findings: Secondary | ICD-10-CM | POA: Diagnosis not present

## 2023-06-21 DIAGNOSIS — Z1331 Encounter for screening for depression: Secondary | ICD-10-CM | POA: Diagnosis not present

## 2023-06-22 ENCOUNTER — Other Ambulatory Visit: Payer: Self-pay

## 2023-06-22 ENCOUNTER — Other Ambulatory Visit (HOSPITAL_COMMUNITY): Payer: Self-pay

## 2023-06-22 MED ORDER — PROGESTERONE MICRONIZED 100 MG PO CAPS
100.0000 mg | ORAL_CAPSULE | Freq: Every day | ORAL | 3 refills | Status: AC
  Filled 2023-06-22: qty 90, 90d supply, fill #0
  Filled 2023-09-27 (×2): qty 90, 90d supply, fill #1
  Filled 2024-01-23: qty 90, 90d supply, fill #2

## 2023-06-22 MED ORDER — ESTRADIOL 0.1 MG/24HR TD PTTW
1.0000 | MEDICATED_PATCH | TRANSDERMAL | 3 refills | Status: AC
  Filled 2023-06-22: qty 24, 84d supply, fill #0
  Filled 2023-09-09: qty 24, 84d supply, fill #1
  Filled 2023-12-14: qty 24, 84d supply, fill #2
  Filled 2024-01-23: qty 24, 84d supply, fill #3

## 2023-07-04 ENCOUNTER — Other Ambulatory Visit (HOSPITAL_COMMUNITY): Payer: Self-pay

## 2023-07-05 ENCOUNTER — Other Ambulatory Visit (HOSPITAL_COMMUNITY): Payer: Self-pay

## 2023-07-06 ENCOUNTER — Other Ambulatory Visit (HOSPITAL_COMMUNITY): Payer: Self-pay

## 2023-07-06 ENCOUNTER — Other Ambulatory Visit: Payer: Self-pay

## 2023-07-06 DIAGNOSIS — F3174 Bipolar disorder, in full remission, most recent episode manic: Secondary | ICD-10-CM | POA: Diagnosis not present

## 2023-07-06 MED ORDER — AMPHETAMINE-DEXTROAMPHETAMINE 20 MG PO TABS
20.0000 mg | ORAL_TABLET | Freq: Every day | ORAL | 0 refills | Status: DC
  Filled 2023-07-06 – 2023-08-16 (×2): qty 30, 30d supply, fill #0

## 2023-07-06 MED ORDER — HYDROXYZINE PAMOATE 50 MG PO CAPS: 50.0000 mg | ORAL_CAPSULE | Freq: Every day | ORAL | 1 refills | Status: AC

## 2023-07-06 MED ORDER — FLUOXETINE HCL 40 MG PO CAPS
40.0000 mg | ORAL_CAPSULE | Freq: Every morning | ORAL | 1 refills | Status: AC
  Filled 2023-07-06: qty 90, 90d supply, fill #0
  Filled 2023-09-27 (×2): qty 90, 90d supply, fill #1

## 2023-07-06 MED ORDER — AMPHETAMINE-DEXTROAMPHETAMINE 20 MG PO TABS
20.0000 mg | ORAL_TABLET | Freq: Every day | ORAL | 0 refills | Status: AC
  Filled 2023-07-06: qty 30, 30d supply, fill #0

## 2023-08-09 ENCOUNTER — Other Ambulatory Visit: Payer: Self-pay

## 2023-08-09 ENCOUNTER — Other Ambulatory Visit (HOSPITAL_COMMUNITY): Payer: Self-pay

## 2023-08-09 MED ORDER — HYDROXYZINE PAMOATE 50 MG PO CAPS
50.0000 mg | ORAL_CAPSULE | Freq: Every day | ORAL | 0 refills | Status: AC | PRN
  Filled 2023-08-09: qty 90, 90d supply, fill #0

## 2023-08-09 MED ORDER — HYDROXYZINE HCL 50 MG PO TABS
50.0000 mg | ORAL_TABLET | Freq: Three times a day (TID) | ORAL | 3 refills | Status: AC
  Filled 2023-08-09: qty 270, 90d supply, fill #0

## 2023-08-16 ENCOUNTER — Other Ambulatory Visit (HOSPITAL_COMMUNITY): Payer: Self-pay

## 2023-09-09 ENCOUNTER — Other Ambulatory Visit (HOSPITAL_COMMUNITY): Payer: Self-pay

## 2023-09-27 ENCOUNTER — Other Ambulatory Visit: Payer: Self-pay

## 2023-09-27 ENCOUNTER — Other Ambulatory Visit (HOSPITAL_COMMUNITY): Payer: Self-pay

## 2023-09-28 ENCOUNTER — Other Ambulatory Visit: Payer: Self-pay

## 2023-09-28 ENCOUNTER — Other Ambulatory Visit (HOSPITAL_COMMUNITY): Payer: Self-pay

## 2023-09-28 MED ORDER — AMPHETAMINE-DEXTROAMPHETAMINE 20 MG PO TABS
20.0000 mg | ORAL_TABLET | Freq: Every day | ORAL | 0 refills | Status: DC
  Filled 2023-09-28: qty 30, 30d supply, fill #0

## 2023-10-03 ENCOUNTER — Other Ambulatory Visit (HOSPITAL_COMMUNITY): Payer: Self-pay

## 2023-10-19 ENCOUNTER — Other Ambulatory Visit: Payer: Self-pay | Admitting: Internal Medicine

## 2023-10-19 DIAGNOSIS — R911 Solitary pulmonary nodule: Secondary | ICD-10-CM

## 2023-11-08 ENCOUNTER — Other Ambulatory Visit (HOSPITAL_COMMUNITY): Payer: Self-pay

## 2023-11-09 ENCOUNTER — Other Ambulatory Visit (HOSPITAL_COMMUNITY): Payer: Self-pay

## 2023-11-09 ENCOUNTER — Other Ambulatory Visit: Payer: Self-pay

## 2023-11-09 MED ORDER — AMPHETAMINE-DEXTROAMPHETAMINE 20 MG PO TABS
20.0000 mg | ORAL_TABLET | Freq: Every day | ORAL | 0 refills | Status: DC
  Filled 2023-11-09: qty 30, 30d supply, fill #0

## 2023-12-14 ENCOUNTER — Other Ambulatory Visit (HOSPITAL_COMMUNITY): Payer: Self-pay

## 2023-12-17 ENCOUNTER — Other Ambulatory Visit (HOSPITAL_COMMUNITY): Payer: Self-pay

## 2023-12-17 MED ORDER — AMPHETAMINE-DEXTROAMPHETAMINE 20 MG PO TABS
20.0000 mg | ORAL_TABLET | Freq: Every day | ORAL | 0 refills | Status: AC
  Filled 2023-12-17: qty 30, 30d supply, fill #0

## 2023-12-19 ENCOUNTER — Other Ambulatory Visit: Payer: Self-pay

## 2024-01-16 ENCOUNTER — Other Ambulatory Visit (HOSPITAL_COMMUNITY): Payer: Self-pay

## 2024-01-17 ENCOUNTER — Encounter (HOSPITAL_COMMUNITY): Payer: Self-pay | Admitting: Pharmacist

## 2024-01-17 ENCOUNTER — Other Ambulatory Visit (HOSPITAL_COMMUNITY): Payer: Self-pay

## 2024-01-23 ENCOUNTER — Other Ambulatory Visit (HOSPITAL_COMMUNITY): Payer: Self-pay

## 2024-01-24 ENCOUNTER — Other Ambulatory Visit: Payer: Self-pay

## 2024-01-24 ENCOUNTER — Other Ambulatory Visit (HOSPITAL_COMMUNITY): Payer: Self-pay

## 2024-01-25 ENCOUNTER — Other Ambulatory Visit (HOSPITAL_COMMUNITY): Payer: Self-pay

## 2024-02-01 ENCOUNTER — Other Ambulatory Visit (HOSPITAL_COMMUNITY): Payer: Self-pay

## 2024-02-01 ENCOUNTER — Encounter (HOSPITAL_COMMUNITY): Payer: Self-pay
